# Patient Record
Sex: Male | Born: 1935 | Race: White | Hispanic: No | Marital: Married | Smoking: Never smoker
Health system: Southern US, Community
[De-identification: ages and names within clinical notes are randomized; demographics above are authoritative.]

## PROBLEM LIST (undated history)

## (undated) DIAGNOSIS — N289 Disorder of kidney and ureter, unspecified: Secondary | ICD-10-CM

## (undated) DIAGNOSIS — H9190 Unspecified hearing loss, unspecified ear: Secondary | ICD-10-CM

## (undated) DIAGNOSIS — M545 Low back pain: Secondary | ICD-10-CM

## (undated) DIAGNOSIS — F32A Depression, unspecified: Secondary | ICD-10-CM

## (undated) DIAGNOSIS — I10 Essential (primary) hypertension: Secondary | ICD-10-CM

## (undated) DIAGNOSIS — H509 Unspecified strabismus: Secondary | ICD-10-CM

## (undated) DIAGNOSIS — M7500 Adhesive capsulitis of unspecified shoulder: Secondary | ICD-10-CM

## (undated) DIAGNOSIS — E669 Obesity, unspecified: Secondary | ICD-10-CM

## (undated) DIAGNOSIS — H3530 Unspecified macular degeneration: Secondary | ICD-10-CM

## (undated) DIAGNOSIS — R413 Other amnesia: Secondary | ICD-10-CM

## (undated) DIAGNOSIS — G20 Parkinson's disease: Secondary | ICD-10-CM

## (undated) DIAGNOSIS — G4752 REM sleep behavior disorder: Secondary | ICD-10-CM

## (undated) DIAGNOSIS — N189 Chronic kidney disease, unspecified: Secondary | ICD-10-CM

## (undated) DIAGNOSIS — E785 Hyperlipidemia, unspecified: Secondary | ICD-10-CM

## (undated) DIAGNOSIS — B029 Zoster without complications: Secondary | ICD-10-CM

## (undated) DIAGNOSIS — F329 Major depressive disorder, single episode, unspecified: Secondary | ICD-10-CM

## (undated) DIAGNOSIS — G8929 Other chronic pain: Secondary | ICD-10-CM

## (undated) DIAGNOSIS — G20A1 Parkinson's disease without dyskinesia, without mention of fluctuations: Secondary | ICD-10-CM

## (undated) HISTORY — DX: Disorder of kidney and ureter, unspecified: N28.9

## (undated) HISTORY — DX: Adhesive capsulitis of unspecified shoulder: M75.00

## (undated) HISTORY — DX: Unspecified hearing loss, unspecified ear: H91.90

## (undated) HISTORY — DX: Depression, unspecified: F32.A

## (undated) HISTORY — DX: Low back pain: M54.5

## (undated) HISTORY — DX: Chronic kidney disease, unspecified: N18.9

## (undated) HISTORY — DX: Other amnesia: R41.3

## (undated) HISTORY — DX: Parkinson's disease: G20

## (undated) HISTORY — DX: Essential (primary) hypertension: I10

## (undated) HISTORY — PX: COLONOSCOPY W/ POLYPECTOMY: SHX1380

## (undated) HISTORY — DX: Zoster without complications: B02.9

## (undated) HISTORY — DX: Obesity, unspecified: E66.9

## (undated) HISTORY — DX: Unspecified macular degeneration: H35.30

## (undated) HISTORY — DX: Unspecified strabismus: H50.9

## (undated) HISTORY — DX: Parkinson's disease without dyskinesia, without mention of fluctuations: G20.A1

## (undated) HISTORY — DX: Other chronic pain: G89.29

## (undated) HISTORY — DX: Hyperlipidemia, unspecified: E78.5

## (undated) HISTORY — DX: Major depressive disorder, single episode, unspecified: F32.9

## (undated) HISTORY — PX: DUPUYTREN CONTRACTURE RELEASE: SHX1478

## (undated) HISTORY — DX: REM sleep behavior disorder: G47.52

## (undated) HISTORY — PX: EYE SURGERY: SHX253

---

## 2008-05-06 ENCOUNTER — Ambulatory Visit: Payer: Self-pay | Admitting: Cardiology

## 2008-05-13 ENCOUNTER — Ambulatory Visit: Payer: Self-pay

## 2008-05-26 ENCOUNTER — Encounter: Admission: RE | Admit: 2008-05-26 | Discharge: 2008-06-10 | Payer: Self-pay | Admitting: Internal Medicine

## 2008-05-28 ENCOUNTER — Ambulatory Visit: Payer: Self-pay | Admitting: Cardiology

## 2008-06-03 ENCOUNTER — Ambulatory Visit: Payer: Self-pay | Admitting: Cardiology

## 2008-06-03 ENCOUNTER — Ambulatory Visit: Payer: Self-pay

## 2008-06-03 ENCOUNTER — Encounter: Payer: Self-pay | Admitting: Cardiology

## 2008-06-03 LAB — CONVERTED CEMR LAB
CO2: 25 meq/L (ref 19–32)
Calcium: 9 mg/dL (ref 8.4–10.5)
Chloride: 105 meq/L (ref 96–112)
Glucose, Bld: 163 mg/dL — ABNORMAL HIGH (ref 70–99)
Potassium: 4.4 meq/L (ref 3.5–5.1)
Sodium: 136 meq/L (ref 135–145)

## 2008-07-14 ENCOUNTER — Encounter: Admission: RE | Admit: 2008-07-14 | Discharge: 2008-07-14 | Payer: Self-pay | Admitting: Neurology

## 2008-11-10 ENCOUNTER — Encounter: Admission: RE | Admit: 2008-11-10 | Discharge: 2009-01-28 | Payer: Self-pay | Admitting: Internal Medicine

## 2010-08-16 NOTE — Assessment & Plan Note (Signed)
[facility]                            CARDIOLOGY OFFICE NOTE   Anthony Lambert, Anthony Lambert                       MRN:          [id number]  DATE:05/28/2008                            DOB:          1934-06-25    PRIMARY CARE PHYSICIAN:  Kendrick Ranch, MD, [facility] care [facility].   CLINICAL HISTORY:  Anthony Lambert returned for followup visit after his  evaluation for chest pain and abnormal ECG.  His electrocardiogram  showed nonspecific ST-T changes and APCs.  He is having atypical pain,  but had positive risk profile with hypertension and hyperlipidemia.  We  did a Myoview scan which was negative for ischemia.   He has felt well and has had minimal chest pain since his last visit.   His past medical history is significant for Parkinson disease which was  recently diagnosed.  He also had a previous history of hand surgery.   Current medications include Lexapro, fenofibrate, aspirin, omega-3,  zinc, and ropinirole.   On examination, there was no venous distention.  The carotid pulses were  full without bruits.  Chest is clear.  Cardiac rhythm is regular.  No  murmurs or gallops.  The abdomen was soft without organomegaly.  Peripheral pulses were full with no peripheral edema.   IMPRESSION:  1. Chest pain and abnormal electrocardiogram, felt to be noncardiac      chest pain.  2. Hypertension.  3. Hyperlipidemia.  4. Parkinson disease.   RECOMMENDATIONS:  I do not believe Anthony Lambert has significant ischemic  heart disease at present.  He indicates his blood pressures at home are  in the 150-160 range, so I think he does require treatment and  hypertension may explain his abnormal ECG.  We will plan to start him on  Lotensin 20 a day, and I will plan to followup echo to evaluate him for  LVH and the abnormal ECG.  We will get a BMP when he comes in.  If these  studies are okay, I will turn him back over to Dr.  Constance Goltz and see  him back on a p.r.n. basis.  He is scheduled to see Dr. Anne Hahn for his  Parkinson disease in a few weeks.     Bruce Elvera Lennox Juanda Chance, MD, [facility]  Electronically Signed    BRB/MedQ  DD: 05/28/2008  DT: 05/28/2008  Job #: [id number]

## 2010-08-16 NOTE — Assessment & Plan Note (Signed)
[facility]                            CARDIOLOGY OFFICE NOTE   HAWTHORNE, DAY                       MRN:          [id number]  DATE:05/06/2008                            DOB:          10-10-1934    CARDIAC CONSULTATION NOTE.   PRIMARY CARE PHYSICIAN:  Kendrick Ranch, M.D. who is at the  [facility], [facility].   REASON FOR REFERRAL:  Evaluation of chest pain and abnormal ECG.   CLINICAL HISTORY:  Mr. Anthony Lambert is a 76 year old and recently moved here  from [place] to be with his wife and son.  He has no prior history of known  heart disease.  He has been having some tightness in his lower chest  over the past year which has not been predictive relating to exertion,  but which sometimes does come on with exertion.  He saw Dr. Constance Goltz  today and she did an electrocardiogram which she faxed over here for  review.  The electrocardiogram showed APCs and showed some flattening of  the T-waves in the lateral precordial leads.  He was referred over here  for further evaluation of his chest pain and abnormal ECG.   He says he has had no symptoms of shortness of breath or palpitations.   PAST MEDICAL HISTORY:  Significant for recently diagnosed Parkinson's  disease.  He has not seen a neurologist since he moved to [place] in  September.  He also has a history of hypertension, but has not been  treated recently.  He had a history of previous hand surgery.  He also  has a history of hyperlipidemia.   CURRENT MEDICATIONS:  1. Lexapro 5 mg daily.  2. Ropinirole 2 mg daily.  3. Fenofibrate 16o mg daily.  4. Aspirin 81 mg daily.  5. Omega-3 and zinc.   FAMILY HISTORY:  Mildly positive for vascular disease.  His mother died  at 20 of a possible stroke and had hypertension.  His father was killed  when the patient was a child.   SOCIAL HISTORY:  He is a retired Brewing technologist.  He does not smoke.  He and  his wife moved here from [place] in the last September to be with  their son.  They have another son who lives in [place].   REVIEW OF SYSTEMS:  Positive for fatigue and constipation.   PHYSICAL EXAMINATION:  VITAL SIGNS:  Blood pressure 140/90, pulse 60 and  regular.  The the weight was 273 pounds.  NECK:  There was no venous tension.  The carotid pulses were full and  there were no bruits.  HEAD:  Normocephalic.  CHEST:  Clear without rales or rhonchi.  CARDIAC:  Rhythm was regular.  The heart sounds were normoactive.  No  murmurs, rubs, or gallops.  ABDOMEN:  Soft with normal bowel sounds.  There is no  hepatosplenomegaly.  EXTREMITIES:  Peripheral pulses were full with no peripheral edema.  MUSCULOSKELETAL:  No deformities.  SKIN:  Warm and dry.  NEUROLOGIC:  No focal neurological signs.   IMPRESSION:  1. Chest pain and  abnormal ECG, rule out ischemia.  2. Parkinson disease.  3. History of hypertension.  4. Hyperlipidemia.   RECOMMENDATIONS:  Mr. Walck symptoms are not typical for ischemic  heart disease, but he does have an abnormal ECG and some risk factors.  Will plan to evaluate him further with a rest stress Myoview scan.  I  will plan to see him back in followup.  After the scan, we can decide  about further evaluation.  I will get some laboratory studies including  a CBC, BMP, lipid, and liver.  We will check it with Dr. Constance Goltz so  as not to repeat any studies that she got there.     Bruce Elvera Lennox Juanda Chance, MD, [facility]  Electronically Signed    BRB/MedQ  DD: 05/06/2008  DT: 05/07/2008  Job #: [id number]   cc:   Kendrick Ranch, M.D.

## 2010-11-03 DIAGNOSIS — F329 Major depressive disorder, single episode, unspecified: Secondary | ICD-10-CM | POA: Insufficient documentation

## 2010-11-03 DIAGNOSIS — Z860100 Personal history of colon polyps, unspecified: Secondary | ICD-10-CM | POA: Insufficient documentation

## 2010-11-14 ENCOUNTER — Encounter: Payer: Self-pay | Admitting: Podiatry

## 2011-09-04 DIAGNOSIS — E785 Hyperlipidemia, unspecified: Secondary | ICD-10-CM | POA: Insufficient documentation

## 2012-04-19 DIAGNOSIS — G4752 REM sleep behavior disorder: Secondary | ICD-10-CM | POA: Insufficient documentation

## 2012-04-19 DIAGNOSIS — S339XXA Sprain of unspecified parts of lumbar spine and pelvis, initial encounter: Secondary | ICD-10-CM | POA: Insufficient documentation

## 2012-04-19 DIAGNOSIS — G20 Parkinson's disease: Secondary | ICD-10-CM | POA: Insufficient documentation

## 2012-04-19 DIAGNOSIS — R1310 Dysphagia, unspecified: Secondary | ICD-10-CM | POA: Insufficient documentation

## 2012-04-19 DIAGNOSIS — R413 Other amnesia: Secondary | ICD-10-CM | POA: Insufficient documentation

## 2012-04-19 DIAGNOSIS — R269 Unspecified abnormalities of gait and mobility: Secondary | ICD-10-CM | POA: Insufficient documentation

## 2012-05-01 ENCOUNTER — Ambulatory Visit (HOSPITAL_COMMUNITY): Admission: RE | Admit: 2012-05-01 | Payer: Self-pay | Source: Ambulatory Visit

## 2012-05-01 ENCOUNTER — Other Ambulatory Visit (HOSPITAL_COMMUNITY): Payer: Self-pay

## 2012-05-01 ENCOUNTER — Other Ambulatory Visit (HOSPITAL_COMMUNITY): Payer: Self-pay | Admitting: Neurology

## 2012-05-01 DIAGNOSIS — R1310 Dysphagia, unspecified: Secondary | ICD-10-CM

## 2012-05-07 ENCOUNTER — Ambulatory Visit (HOSPITAL_COMMUNITY)
Admission: RE | Admit: 2012-05-07 | Discharge: 2012-05-07 | Disposition: A | Discharge status: Home / Self Care | Payer: Medicare Other | Source: Ambulatory Visit | Attending: Neurology | Admitting: Neurology

## 2012-05-07 DIAGNOSIS — H3530 Unspecified macular degeneration: Secondary | ICD-10-CM | POA: Insufficient documentation

## 2012-05-07 DIAGNOSIS — R1310 Dysphagia, unspecified: Secondary | ICD-10-CM

## 2012-05-07 DIAGNOSIS — R1312 Dysphagia, oropharyngeal phase: Secondary | ICD-10-CM | POA: Insufficient documentation

## 2012-05-07 NOTE — Procedures (Cosign Needed)
Objective Swallowing Evaluation: Modified Barium Swallowing Study  Patient Details  Name: Anthony Lambert MRN: [id number] Date of Birth: March 14, 1935  Today's Date: 05/07/2012 Time: 1106-1210 SLP Time Calculation (min): 64 min  Past Medical History: No past medical history on file. Past Surgical History: No past surgical history on file. HPI:  Anthony Lambert is a 78 year old man with dx of PD. He was referred to [facility] by his primary doctor for reported dysphagia and pt c/o coughing/choking after meals and increased belching during meals with symptoms persisting over past 5 years.      Assessment / Plan / Recommendation Clinical Impression  Dysphagia Diagnosis: Suspected primary esophageal dysphagia;Mild oral phase dysphagia;Mild pharyngeal phase dysphagia Clinical impression: Patient presents with a mild orophayrngeal dysphagia. Oral phase characterized by oral discoordination resulting in a delay in pharyngeal swallow initiation and oral residue with thin liquids with premature spillage to the level of the valleculae and at times to the pyriform sinuses as patient clears with spontaneous dry swallow. The combination of above results in mild penetration of thin liquids which remained above vocal cords. SLP provided verbal cues for chin tuck which did not prevent penetration however verbal cues for small, single sips which reduced the amount of pooling in the valleculae prior to swallow and prevented penetration. Patient presented with strong pharyngeal strength and airway protection with all solid po's, however continued to produce a spontaneous cough after the swallow. Question indicative of possible reflux/esophageal disorder given known h/o GERD?  Defer management of possible GERD-like symptoms to MD. Therapist educated patient and wife on risks of aspiration and recommendations for taking small sips of thin liquid between bites, no straws, and to take meds with puree. Therapist also  recommends possible f/u with outside SLP for voice/swallowing therapy. Patient stated he would consider f/u therapy.     Treatment Recommendation  Defer treatment plan to SLP at (Comment) (OP)    Diet Recommendation Regular;Thin liquid   Liquid Administration via: No straw;Cup Medication Administration: Whole meds with puree Supervision: Patient able to self feed;Intermittent supervision to cue for compensatory strategies Compensations: Slow rate;Small sips/bites;Follow solids with liquid Postural Changes and/or Swallow Maneuvers: Seated upright 90 degrees;Upright 30-60 min after meal    Other  Recommendations Oral Care Recommendations: Oral care BID   Follow Up Recommendations  Outpatient SLP       Pertinent Vitals/Pain None reported     General Date of Onset:  (patient reported symptoms started 5 years ago) HPI: Anthony Lambert is a 78 year old man with dx of PD. He was [facility] to [facility] by his primary doctor for reported dysphagia and pt c/o coughing/choking after meals and increased belching during meals with symptoms persisting over past 5 years.  Type of Study: Modified Barium Swallowing Study Reason for Referral: Objectively evaluate swallowing function Previous Swallow Assessment: none Diet Prior to this Study: Regular;Thin liquids Temperature Spikes Noted: No Respiratory Status: Room air History of Recent Intubation: No Behavior/Cognition: Alert;Cooperative;Pleasant mood;Hard of hearing Oral Cavity - Dentition: Adequate natural dentition Oral Motor / Sensory Function: Within functional limits Self-Feeding Abilities: Able to feed self Patient Positioning: Upright in chair Baseline Vocal Quality: Low vocal intensity;Clear Volitional Cough: Strong Volitional Swallow: Able to elicit Anatomy: Within functional limits Pharyngeal Secretions: Not observed secondary MBS    Reason for Referral Objectively evaluate swallowing function   Oral Phase Oral  Preparation/Oral Phase Oral Phase: Impaired Oral - Pudding Oral - Pudding Teaspoon: Not Tested Oral - Pudding  Cup: Not tested Oral - Thin Oral - Thin Cup: Lingual pumping;Lingual/palatal residue;Delayed oral transit (decreased bolus containment/cohesion) Oral - Thin Straw: Lingual pumping;Delayed oral transit;Lingual/palatal residue (decreased bolus containment/cohesion) Oral - Solids Oral - Puree: Within functional limits Oral - Mechanical Soft: Within functional limits Oral - Pill: Lingual pumping (provided whole with pureed solid)   Pharyngeal Phase Pharyngeal Phase Pharyngeal Phase: Impaired Pharyngeal - Thin Pharyngeal - Thin Cup: Delayed swallow initiation;Premature spillage to valleculae;Penetration/Aspiration before swallow (oral residuals pooled to vallecula post swallow; cleared ) Penetration/Aspiration details (thin cup): Material enters airway, remains ABOVE vocal cords and not ejected out (cued throat clear cleared penetrates) Pharyngeal - Thin Straw: Penetration/Aspiration before swallow;Delayed swallow initiation;Premature spillage to valleculae Penetration/Aspiration details (thin straw): Material enters airway, remains ABOVE vocal cords and not ejected out (penetrates cleared with cued throat clear) Pharyngeal - Solids Pharyngeal - Puree: Delayed swallow initiation;Premature spillage to valleculae Pharyngeal - Mechanical Soft: Within functional limits (Pt coughed, but no aspiration observed) Pharyngeal - Pill: Within functional limits (provided whole with pureed solid)  Cervical Esophageal Phase    GO    Cervical Esophageal Phase Cervical Esophageal Phase: [facility]    Functional Assessment Tool Used: skilled observation Functional Limitations: Swallowing Swallow Current Status (W0981): At least 20 percent but less than 40 percent impaired, limited or restricted Swallow Goal Status [phone]): At least 20 percent but less than 40 percent impaired, limited or  restricted Swallow Discharge Status [phone]): At least 20 percent but less than 40 percent impaired, limited or restricted   Berdine Dance SLP student Berdine Dance 05/07/2012, 2:25 PM

## 2012-08-04 ENCOUNTER — Other Ambulatory Visit (HOSPITAL_COMMUNITY): Payer: Self-pay | Admitting: Neurology

## 2012-08-07 ENCOUNTER — Other Ambulatory Visit: Payer: Self-pay

## 2012-08-07 MED ORDER — CARBIDOPA-LEVODOPA 25-250 MG PO TABS: ORAL_TABLET | ORAL | Status: DC

## 2012-08-07 NOTE — Telephone Encounter (Signed)
Pharmacy Called requesting we resend rx.

## 2012-08-13 ENCOUNTER — Encounter: Payer: Self-pay | Admitting: Neurology

## 2012-08-13 ENCOUNTER — Ambulatory Visit (INDEPENDENT_AMBULATORY_CARE_PROVIDER_SITE_OTHER): Payer: Medicare Other | Admitting: Neurology

## 2012-08-13 VITALS — BP 111/66 | HR 63 | Ht 73.5 in | Wt 255.0 lb

## 2012-08-13 DIAGNOSIS — G4752 REM sleep behavior disorder: Secondary | ICD-10-CM

## 2012-08-13 DIAGNOSIS — G20 Parkinson's disease: Secondary | ICD-10-CM

## 2012-08-13 DIAGNOSIS — R269 Unspecified abnormalities of gait and mobility: Secondary | ICD-10-CM

## 2012-08-13 DIAGNOSIS — R1310 Dysphagia, unspecified: Secondary | ICD-10-CM

## 2012-08-13 DIAGNOSIS — R413 Other amnesia: Secondary | ICD-10-CM

## 2012-08-13 MED ORDER — CARBIDOPA-LEVODOPA 25-250 MG PO TABS: ORAL_TABLET | ORAL | Status: DC

## 2012-08-13 MED ORDER — DONEPEZIL HCL 10 MG PO TABS: 10.0000 mg | ORAL_TABLET | Freq: Every day | ORAL | Status: DC

## 2012-08-13 NOTE — Progress Notes (Signed)
Reason for visit: Parkinson's disease  Anthony Lambert is an 78 y.o. male  History of present illness:  Anthony Lambert is a 78 year old right-handed white male with a history of Parkinson's disease and a memory disturbance. The patient has been tapered off of the Requip, and he feels better off of the medication. The patient is on Sinemet, but he never went up on the dose, and he is still taking one half of a 25/250 mg tablet 3 times daily. The patient goes to bed around 3 AM, and he wakes up around 12 noon or 1 PM. The patient does report some mild problems with swallowing. The patient has low back pain with standing, and he has a chronically stooped posture. The pain in the low back goes away when he sits down. The patient denies any falls. Overall, the patient believes that he is functioning somewhat better off of the Requip. He still has some daytime drowsiness.  Past Medical History  Diagnosis Date  . Parkinson's disease   . Depression   . Dyslipidemia   . Hypertension   . Obesity   . Frozen shoulder     Left  . Memory disturbance   . REM sleep behavior disorder   . Chronic low back pain   . Macular degeneration   . Chronic renal insufficiency   . Shingles     Right partietal shingles    Past Surgical History  Procedure Laterality Date  . Colonoscopy w/ polypectomy    . Dupuytren contracture release Bilateral     Family History  Problem Relation Age of Onset  . Cancer - Colon Mother   . Cancer Sister     Breast cancer  . Cancer - Prostate Brother     Social history:  reports that he has never smoked. He does not have any smokeless tobacco history on file. He reports that  drinks alcohol. He reports that he does not use illicit drugs.  Allergies: No Known Allergies  Medications:  Current Outpatient Prescriptions on File Prior to Visit  Medication Sig Dispense Refill  . fenofibrate 160 MG tablet Take 160 mg by mouth daily.        Marland Kitchen lisinopril (PRINIVIL,ZESTRIL) 20 MG  tablet Take 20 mg by mouth daily.        . OMEGA 3 1000 MG CAPS Take 1,000 mg by mouth 3 (three) times daily.         No current facility-administered medications on file prior to visit.    ROS:  Out of a complete 14 system review of symptoms, the patient complains only of the following symptoms, and all other reviewed systems are negative.  Fatigue Hearing loss Visual blurring Blood in stool, constipation Impotence Memory loss, numbness, weakness Tremor Decreased energy  Blood pressure 111/66, pulse 63, height 6' 1.5" (1.867 m), weight 255 lb (115.667 kg).  Physical Exam  General: The patient is alert and cooperative at the time of the examination. The patient is moderately obese.  Skin: No significant peripheral edema is noted.   Neurologic Exam  Mental status: Mini-Mental status examination reveals a total score of 28/30. The patient is able to name 15 animals in 60 seconds.  Cranial nerves: Facial symmetry is present. Speech is normal, no aphasia or dysarthria is noted. Extraocular movements are full. Visual fields are full. Masking of the face is noted.  Motor: The patient has good strength in all 4 extremities. The patient is able to arise from a seated position with the  arms crossed.  Coordination: The patient has good finger-nose-finger and heel-to-shin bilaterally.  Gait and station: The patient has a stooped posture with walking. The patient has decreased arm swing bilaterally, right greater than left, with bilateral resting tremors seen. Tandem gait is unsteady. Romberg is negative. No drift is seen.  Reflexes: Deep tendon reflexes are symmetric, but are depressed.   Assessment/Plan:  1. Parkinson's disease  2. Memory disturbance  The patient will be maintained on Aricept. The Sinemet will once again will be increased to 1 full tablet in the morning, one half tablet at midday and in the evening. The patient will followup in 4-5 months.  Anthony Palau  MD 08/13/2012 7:33 PM  [facility] [address] [place], [place] [address]  Phone [phone] Fax [phone]

## 2012-09-07 DIAGNOSIS — K5900 Constipation, unspecified: Secondary | ICD-10-CM | POA: Insufficient documentation

## 2012-09-22 ENCOUNTER — Other Ambulatory Visit: Payer: Self-pay | Admitting: Neurology

## 2012-12-17 ENCOUNTER — Other Ambulatory Visit: Payer: Self-pay

## 2012-12-17 MED ORDER — DONEPEZIL HCL 10 MG PO TABS: 10.0000 mg | ORAL_TABLET | Freq: Every evening | ORAL | Status: DC

## 2012-12-17 NOTE — Telephone Encounter (Signed)
Anthony Lambert called requesting a refill of Aricept be sent to [facility].

## 2013-02-05 ENCOUNTER — Telehealth: Payer: Self-pay | Admitting: Neurology

## 2013-02-05 ENCOUNTER — Encounter (INDEPENDENT_AMBULATORY_CARE_PROVIDER_SITE_OTHER): Payer: Self-pay

## 2013-02-05 ENCOUNTER — Encounter: Payer: Self-pay | Admitting: Neurology

## 2013-02-05 ENCOUNTER — Ambulatory Visit (INDEPENDENT_AMBULATORY_CARE_PROVIDER_SITE_OTHER): Payer: Medicare Other | Admitting: Neurology

## 2013-02-05 VITALS — BP 118/60 | HR 70 | Wt 252.0 lb

## 2013-02-05 DIAGNOSIS — R269 Unspecified abnormalities of gait and mobility: Secondary | ICD-10-CM

## 2013-02-05 DIAGNOSIS — R413 Other amnesia: Secondary | ICD-10-CM

## 2013-02-05 DIAGNOSIS — R209 Unspecified disturbances of skin sensation: Secondary | ICD-10-CM

## 2013-02-05 DIAGNOSIS — G20 Parkinson's disease: Secondary | ICD-10-CM

## 2013-02-05 MED ORDER — CARBIDOPA-LEVODOPA 25-250 MG PO TABS: 1.0000 | ORAL_TABLET | Freq: Three times a day (TID) | ORAL | Status: DC

## 2013-02-05 NOTE — Progress Notes (Signed)
Reason for visit: Parkinson's disease  Anthony Lambert is an 78 y.o. male  History of present illness:  Anthony Lambert is a 78 year old right-handed white male with a history of Parkinson's disease. The patient has a stooped posture, and he indicates that he has a lot of low back pain when he is up walking. The patient will be up 5 minutes, and he will begin to have low back pain. The patient is not exercising much in part secondary to this. The patient believes that over the last several months, he has developed some problems with numbness of the right hand, and he is having difficulty holding an eating utensil in the hands. The patient has difficulty feeding himself. The patient reports that he has some neck pain when lying down at night, but the wife indicates that he uses 3 pillows to support his head. The patient has not gained much benefit from the slight increase in the Sinemet dosing on his last visit. The patient currently takes the 25/250 mg Sinemet tablets, one tablet in the morning, one half tablet at noon and half in the evening. The patient usually gets to sleep around 3 AM, waking up around 12 noon. The patient denies any falls, and he uses a cane for ambulation. The patient continues to have some memory issues. The patient does not operate a motor vehicle.  Past Medical History  Diagnosis Date  . Parkinson's disease   . Depression   . Dyslipidemia   . Hypertension   . Obesity   . Frozen shoulder     Left  . Memory disturbance   . REM sleep behavior disorder   . Chronic low back pain   . Macular degeneration   . Chronic renal insufficiency   . Shingles     Right partietal shingles    Past Surgical History  Procedure Laterality Date  . Colonoscopy w/ polypectomy    . Dupuytren contracture release Bilateral     Family History  Problem Relation Age of Onset  . Cancer - Colon Mother   . Cancer Sister     Breast cancer  . Cancer - Prostate Brother     Social history:   reports that he has never smoked. He has never used smokeless tobacco. He reports that he drinks alcohol. He reports that he does not use illicit drugs.   No Known Allergies  Medications:  Current Outpatient Prescriptions on File Prior to Visit  Medication Sig Dispense Refill  . donepezil (ARICEPT) 10 MG tablet Take 1 tablet (10 mg total) by mouth every evening.  90 tablet  1  . fenofibrate 160 MG tablet Take 160 mg by mouth daily.        Marland Kitchen lisinopril (PRINIVIL,ZESTRIL) 20 MG tablet Take 20 mg by mouth daily.        . OMEGA 3 1000 MG CAPS Take 1,000 mg by mouth 3 (three) times daily.         No current facility-administered medications on file prior to visit.    ROS:  Out of a complete 14 system review of symptoms, the patient complains only of the following symptoms, and all other reviewed systems are negative.  Hearing loss Achy muscles Memory loss, confusion, numbness, weakness, tremor Decreased energy Gait instability  Blood pressure 118/60, pulse 70, weight 252 lb (114.306 kg).  Physical Exam  General: The patient is alert and cooperative at the time of the examination. The patient is moderately obese.  Skin: No significant peripheral edema  is noted.   Neurologic Exam  Mental status: The Mini-Mental status examination done today shows a total score of 26/30.  Cranial nerves: Facial symmetry is present. Speech is normal, no aphasia or dysarthria is noted. Extraocular movements are full. Visual fields are full.  Motor: The patient has good strength in all 4 extremities.  Sensory examination: Soft touch sensation on the face, arms, and legs is symmetric.  Coordination: The patient has good finger-nose-finger and heel-to-shin bilaterally. Resting tremors are prominent both upper chimneys, left lower extremity.  Gait and station: The patient is able to arise from a seated position with arms crossed. Once up, the patient reveals a stooped posture, slightly decreased arm  swing, prominent tremors in both arms. The patient has relatively good stride with walking, some hesitation with initiation of walking, and with turns. Tandem gait is slightly unsteady. Romberg is negative. No drift is seen.  Reflexes: Deep tendon reflexes are symmetric.   Assessment/Plan:  1. Parkinson's disease  2. Gait disturbance  3. Low back pain  4. Right hand numbness  The patient will be set up for nerve conduction studies of the upper extremities to exclude the possibility of carpal tunnel syndrome that may be causing his difficulty with using his right hand. The patient has low back pain likely associated with a posture associated with Parkinson's disease. The patient will be given a low back brace to see this helps. The patient has been encouraged to enter an exercise program. The patient will be increased on Sinemet taking the 25/250 mg tablets, one tablet 3 times daily. The patient will followup in 3-4 months.  [name] MD 02/05/2013 6:02 PM  [facility] [address] [place], [place] [address]  Phone [phone] Fax [phone]

## 2013-02-05 NOTE — Patient Instructions (Signed)

## 2013-02-14 ENCOUNTER — Ambulatory Visit (INDEPENDENT_AMBULATORY_CARE_PROVIDER_SITE_OTHER): Payer: Medicare Other

## 2013-02-14 DIAGNOSIS — R209 Unspecified disturbances of skin sensation: Secondary | ICD-10-CM

## 2013-02-14 NOTE — Procedures (Signed)
     HISTORY:  Anthony Lambert is a 78 year old patient with a history of Parkinson's disease who has noted some problems with numbness of the right hand over the last several months, with difficulty using the hand to feed himself. The patient is being evaluated for a possible neuropathy.  NERVE CONDUCTION STUDIES:  Nerve conduction studies were performed on both upper extremities. The distal motor latencies and motor amplitudes for the median and ulnar nerves were within normal limits. The F wave latencies and nerve conduction velocities for these nerves were also normal. The sensory latencies for the median and ulnar nerves were normal.   EMG STUDIES:  EMG evaluation was not performed.  IMPRESSION:  Nerve conduction studies of both upper extremities were unremarkable. There is no evidence of carpal tunnel syndrome on either side.  Anthony Palau MD 02/14/2013 3:50 PM  [facility] [address] [place], [place] [address]  Phone [phone] Fax [phone]

## 2013-02-14 NOTE — Telephone Encounter (Signed)
I called the patient. The NCV of the arms was normal. The study does not explain the numbness in the right hand, with difficulty using the hand. If this progresses, workup on this week, the patient is to let me know.

## 2013-02-19 DIAGNOSIS — M1990 Unspecified osteoarthritis, unspecified site: Secondary | ICD-10-CM | POA: Insufficient documentation

## 2013-05-09 ENCOUNTER — Other Ambulatory Visit: Payer: Self-pay | Admitting: Neurology

## 2013-05-30 DIAGNOSIS — Z6833 Body mass index (BMI) 33.0-33.9, adult: Secondary | ICD-10-CM | POA: Insufficient documentation

## 2013-05-30 DIAGNOSIS — Z789 Other specified health status: Secondary | ICD-10-CM | POA: Insufficient documentation

## 2013-06-04 DIAGNOSIS — I10 Essential (primary) hypertension: Secondary | ICD-10-CM | POA: Insufficient documentation

## 2013-06-11 ENCOUNTER — Encounter (INDEPENDENT_AMBULATORY_CARE_PROVIDER_SITE_OTHER): Payer: Self-pay

## 2013-06-11 ENCOUNTER — Encounter: Payer: Self-pay | Admitting: Neurology

## 2013-06-11 ENCOUNTER — Ambulatory Visit (INDEPENDENT_AMBULATORY_CARE_PROVIDER_SITE_OTHER): Payer: Medicare Other | Admitting: Neurology

## 2013-06-11 VITALS — BP 108/59 | HR 66 | Wt 254.0 lb

## 2013-06-11 DIAGNOSIS — G20A1 Parkinson's disease without dyskinesia, without mention of fluctuations: Secondary | ICD-10-CM

## 2013-06-11 DIAGNOSIS — G20 Parkinson's disease: Secondary | ICD-10-CM

## 2013-06-11 DIAGNOSIS — R413 Other amnesia: Secondary | ICD-10-CM

## 2013-06-11 DIAGNOSIS — R269 Unspecified abnormalities of gait and mobility: Secondary | ICD-10-CM

## 2013-06-11 NOTE — Progress Notes (Signed)
Reason for visit: Parkinson's disease  Anthony Lambert is an 79 y.o. male  History of present illness:  Anthony Lambert is a 78 year old right-handed white male with a history of Parkinson's disease and a memory disorder. The patient has prominent tremors on all 4 extremities, right side worse than the left. The patient has had one fall since last seen when he tripped over his dog. The patient has chronic low back pain, but he is trying to remain active, walking on a regular basis. The patient has had no other new medical issues that have come up since last seen. The patient does report significant issues with constipation, and he currently is on daily MiraLax. The patient is not getting full control of the constipation on this medication. The patient may be going for a colonoscopy in the near future. The patient is on Sinemet taking the 25/250 mg tablets 3 times daily. The patient goes to bed around 10 PM, wakes up at 2 AM, and in may watch a movie until 4 or 5 AM. The patient will nap in the morning. The patient functions better in the afternoon. The patient does have occasional episodes of freezing with walking. The patient denies any issues with swallowing or choking. The memory remains a problem, and the patient is on Aricept.   Past Medical History  Diagnosis Date  . Parkinson's disease   . Depression   . Dyslipidemia   . Hypertension   . Obesity   . Frozen shoulder     Left  . Memory disturbance   . REM sleep behavior disorder   . Chronic low back pain   . Macular degeneration   . Chronic renal insufficiency   . Shingles     Right partietal shingles    Past Surgical History  Procedure Laterality Date  . Colonoscopy w/ polypectomy    . Dupuytren contracture release Bilateral     Family History  Problem Relation Age of Onset  . Cancer - Colon Mother   . Cancer Sister     Breast cancer  . Cancer - Prostate Brother     Social history:  reports that he has never smoked. He  has never used smokeless tobacco. He reports that he drinks alcohol. He reports that he does not use illicit drugs.   No Known Allergies  Medications:  Current Outpatient Prescriptions on File Prior to Visit  Medication Sig Dispense Refill  . ARICEPT 10 MG tablet TAKE 1 TABLET EVERY EVENING  90 tablet  1  . carbidopa-levodopa (SINEMET IR) 25-250 MG per tablet Take 1 tablet by mouth 3 (three) times daily. ONE FULL TABLET IN THE MORNING, ONE HALF TABLET AT NOON AND ONE HALF TABLET IN THE EVENING.  270 tablet  3  . fenofibrate 160 MG tablet Take 160 mg by mouth daily.        Marland Kitchen lisinopril (PRINIVIL,ZESTRIL) 20 MG tablet Take 20 mg by mouth daily.        . OMEGA 3 1000 MG CAPS Take 1,000 mg by mouth 3 (three) times daily.         No current facility-administered medications on file prior to visit.    ROS:  Out of a complete 14 system review of symptoms, the patient complains only of the following symptoms, and all other reviewed systems are negative.  Activity change  Hearing loss, ringing in the ears Dryness of the eyes Rectal bleeding, constipation, rectal pain Insomnia, sleep talking Back pain, walking difficulties, coordination  problems Numbness, tremors Decreased concentration  Blood pressure 108/59, pulse 66, weight 254 lb (115.214 kg).  Physical Exam  General: The patient is alert and cooperative at the time of the examination. The patient is moderately obese.  Skin: No significant peripheral edema is noted.   Neurologic Exam  Mental status: The Mini-Mental status examination done today shows a total score of 27/30.  Cranial nerves: Facial symmetry is present. Speech is normal, no aphasia or dysarthria is noted. Extraocular movements are full, but with superior gaze, there is divergence of the left eye. Visual fields are full. Masking of the face is seen.  Motor: The patient has good strength in all 4 extremities.  Sensory examination: Soft touch sensation is symmetric  on the face, arms, and legs.  Coordination: The patient has good finger-nose-finger and heel-to-shin bilaterally. The patient has prominent tremors on all 4 extremities, right greater left tremors are a true resting tremor.  Gait and station: The patient  Is able to arise from a seated position with the arms crossed.  Once up, the patient is able ambulate independently.there is some decreased arm swing bilaterally. Bilateral tremors are noted with the arms, right greater than left. Stride is somewhat shortened. Tandem gait was not attempted. Romberg is negative.   Reflexes: Deep tendon reflexes are symmetric.   Assessment/Plan:  One. Parkinson's disease  2. Memory disorder  3. Gait disorder  4. Tremors  5. Constipation  The patient is having significant issues with constipation. If medications do not seem to improve this issue, Linzess may be used in the future. The patient may be considered for addition of selegiline in the future. The patient will remain on his current dose of Sinemet at this time, and he will followup in about 4 months.  Anthony Lambert. Keith Sumer Moorehouse MD 06/11/2013 3:30 PM  [facility] [address] [place], [place] [id number]  Phone [phone] Fax [phone]

## 2013-06-11 NOTE — Patient Instructions (Signed)

## 2013-07-14 ENCOUNTER — Other Ambulatory Visit: Payer: Self-pay | Admitting: Gastroenterology

## 2013-07-14 DIAGNOSIS — K635 Polyp of colon: Secondary | ICD-10-CM

## 2013-07-15 ENCOUNTER — Other Ambulatory Visit: Payer: Medicare Other

## 2013-07-17 ENCOUNTER — Other Ambulatory Visit: Payer: Medicare Other

## 2013-09-30 ENCOUNTER — Other Ambulatory Visit: Payer: Self-pay | Admitting: Neurology

## 2013-10-29 ENCOUNTER — Encounter: Payer: Self-pay | Admitting: Podiatry

## 2013-10-29 ENCOUNTER — Ambulatory Visit (INDEPENDENT_AMBULATORY_CARE_PROVIDER_SITE_OTHER): Payer: Medicare Other | Admitting: Podiatry

## 2013-10-29 VITALS — BP 113/63 | HR 60 | Ht 74.0 in | Wt 248.0 lb

## 2013-10-29 DIAGNOSIS — M79605 Pain in left leg: Secondary | ICD-10-CM

## 2013-10-29 DIAGNOSIS — M79604 Pain in right leg: Secondary | ICD-10-CM | POA: Insufficient documentation

## 2013-10-29 DIAGNOSIS — R269 Unspecified abnormalities of gait and mobility: Secondary | ICD-10-CM

## 2013-10-29 DIAGNOSIS — B351 Tinea unguium: Secondary | ICD-10-CM

## 2013-10-29 DIAGNOSIS — M79609 Pain in unspecified limb: Secondary | ICD-10-CM

## 2013-10-29 DIAGNOSIS — M79606 Pain in leg, unspecified: Secondary | ICD-10-CM

## 2013-10-29 NOTE — Progress Notes (Signed)
Subjective: 79 year old male presents with wife complaining of having nail problem and hearing problem.  He has paralysis and has difficulty walking. Toes hurt.  Objective: Dermatologic: Thick yellow hypertrophic nails 1-5 right. Left side nails are normal. Vascular: All pedal pulses are palpable. No edema or erythema noted. Neurologic: Decreased response to Monofilament sensory testing bilateral. Orthopedic: Rectus foot without gross deformities.  Assessment: Onychomycosis x 5 right. Difficulty walking. Pain in toes.  Plan: Palliation prn. All nails debrided.  Patient wishes to return in 2 month for routine foot care.

## 2013-10-29 NOTE — Patient Instructions (Signed)
Seen for hypertrophic nails. All nails debrided. Return in 2 months or as needed.  

## 2013-11-28 ENCOUNTER — Ambulatory Visit (INDEPENDENT_AMBULATORY_CARE_PROVIDER_SITE_OTHER): Payer: Medicare Other | Admitting: Neurology

## 2013-11-28 ENCOUNTER — Encounter: Payer: Self-pay | Admitting: Neurology

## 2013-11-28 VITALS — BP 110/88 | HR 74 | Wt 243.0 lb

## 2013-11-28 DIAGNOSIS — G20 Parkinson's disease: Secondary | ICD-10-CM

## 2013-11-28 DIAGNOSIS — G4752 REM sleep behavior disorder: Secondary | ICD-10-CM

## 2013-11-28 DIAGNOSIS — R413 Other amnesia: Secondary | ICD-10-CM

## 2013-11-28 DIAGNOSIS — R269 Unspecified abnormalities of gait and mobility: Secondary | ICD-10-CM

## 2013-11-28 MED ORDER — CARBIDOPA-LEVODOPA 25-250 MG PO TABS: 1.0000 | ORAL_TABLET | Freq: Three times a day (TID) | ORAL | Status: DC

## 2013-11-28 NOTE — Patient Instructions (Signed)
Parkinson Disease Parkinson disease is a disorder of the central nervous system, which includes the brain and spinal cord. A person with this disease slowly loses the ability to completely control body movements. Within the brain, there is a group of nerve cells (basal ganglia) that help control movement. The basal ganglia are damaged and do not work properly in a person with Parkinson disease. In addition, the basal ganglia produce and use a brain chemical called dopamine. The dopamine chemical sends messages to other parts of the body to control and coordinate body movements. Dopamine levels are low in a person with Parkinson disease. If the dopamine levels are low, then the body does not receive the correct messages it needs to move normally.  CAUSES  The exact reason why the basal ganglia get damaged is not known. Some medical researchers have thought that infection, genes, environment, and certain medicines may contribute to the cause.  SYMPTOMS   An early symptom of Parkinson disease is often an uncontrolled shaking (tremor) of the hands. The tremor will often disappear when the affected hand is consciously used.  As the disease progresses, walking, talking, getting out of a chair, and new movements become more difficult.  Muscles get stiff and movements become slower.  Balance and coordination become harder.  Depression, trouble swallowing, urinary problems, constipation, and sleep problems can occur.  Later in the disease, memory and thought processes may deteriorate. DIAGNOSIS  There are no specific tests to diagnose Parkinson disease. You may be referred to a neurologist for evaluation. Your caregiver will ask about your medical history, symptoms, and perform a physical exam. Blood tests and imaging tests of your brain may be performed to rule out other diseases. The imaging tests may include an MRI or a CT scan. TREATMENT  The goal of treatment is to relieve symptoms. Medicines may be  prescribed once the symptoms become troublesome. Medicine will not stop the progression of the disease, but medicine can make movement and balance better and help control tremors. Speech and occupational therapy may also be prescribed. Sometimes, surgical treatment of the brain can be done in young people. HOME CARE INSTRUCTIONS  Get regular exercise and rest periods during the day to help prevent exhaustion and depression.  If getting dressed becomes difficult, replace buttons and zippers with Velcro and elastic on your clothing.  Take all medicine as directed by your caregiver.  Install grab bars or railings in your home to prevent falls.  Go to speech or occupational therapy as directed.  Keep all follow-up visits as directed by your caregiver. SEEK MEDICAL CARE IF:  Your symptoms are not controlled with your medicine.  You fall.  You have trouble swallowing or choke on your food. MAKE SURE YOU:  Understand these instructions.  Will watch your condition.  Will get help right away if you are not doing well or get worse. Document Released: 03/17/2000 Document Revised: 07/15/2012 Document Reviewed: 04/19/2011 Exit[employer] Patient Information 2015 [employer]. This information is not intended to replace advice given to you by your health care provider. Make sure you discuss any questions you have with your health care provider.  

## 2013-11-28 NOTE — Progress Notes (Signed)
Reason for visit: Parkinson's disease  Anthony Lambert is an 79 y.o. male  History of present illness:  Anthony Lambert is a 79 year old right-handed white male with a history of Parkinson's disease. The patient has had ongoing problems with constipation. The patient has been given a trial on Linzess, but he could not tolerate the medication because of abdominal cramps. The patient has had one fall in the shower otherwise, he has had good stability with his ability to ambulate. He has prominent tremors in both arms, right greater than left. The patient remains on his Sinemet taking the 25/250 mg tablet, one full tablet in the morning, and one half tablet at noon and evening. He is on Aricept for his memory issue. He continues to take MiraLax for his bowels. He returns to the office today for an evaluation.  Past Medical History  Diagnosis Date  . Parkinson's disease   . Depression   . Dyslipidemia   . Hypertension   . Obesity   . Frozen shoulder     Left  . Memory disturbance   . REM sleep behavior disorder   . Chronic low back pain   . Macular degeneration   . Chronic renal insufficiency   . Shingles     Right partietal shingles    Past Surgical History  Procedure Laterality Date  . Colonoscopy w/ polypectomy    . Dupuytren contracture release Bilateral     Family History  Problem Relation Age of Onset  . Cancer - Colon Mother   . Cancer Sister     Breast cancer  . Cancer - Prostate Brother     Social history:  reports that he has never smoked. He has never used smokeless tobacco. He reports that he drinks alcohol. He reports that he does not use illicit drugs.   No Known Allergies  Medications:  Current Outpatient Prescriptions on File Prior to Visit  Medication Sig Dispense Refill  . ARICEPT 10 MG tablet TAKE 1 TABLET EVERY EVENING  90 tablet  4  . fenofibrate 160 MG tablet Take 160 mg by mouth daily.        Marland Kitchen lisinopril (PRINIVIL,ZESTRIL) 20 MG tablet Take 20 mg  by mouth daily.        . OMEGA 3 1000 MG CAPS Take 1,000 mg by mouth 3 (three) times daily.        . polyethylene glycol (MIRALAX / GLYCOLAX) packet Take 17 g by mouth daily.       No current facility-administered medications on file prior to visit.    ROS:  Out of a complete 14 system review of symptoms, the patient complains only of the following symptoms, and all other reviewed systems are negative.  Hearing loss Rectal bleeding, constipation, rectal pain Insomnia, daytime sleepiness, sleep talking Back pain, muscle cramps, walking difficulty Numbness, tremors Decreased concentration  Blood pressure 110/88, pulse 74, weight 243 lb (110.224 kg).  Physical Exam  General: The patient is alert and cooperative at the time of the examination.  Skin: No significant peripheral edema is noted.   Neurologic Exam  Mental status: The Mini-Mental status examination done today shows a total score of 25/30.  Cranial nerves: Facial symmetry is present. Speech is normal, no aphasia or dysarthria is noted. Speech is somewhat hypophonic. Extraocular movements are full, with exception that there is some divergence of gaze with exotropia of the left eye with superior gaze. Visual fields are full.  Motor: The patient has good strength in  all 4 extremities.  Sensory examination: Soft touch sensation is symmetric on the face, arms, and legs.  Coordination: The patient has good finger-nose-finger and heel-to-shin bilaterally. Prominent resting tremors are seen bilaterally, right greater than left. A resting tremor is also seen with the right leg.  Gait and station: The patient has a normal gait. The patient has good arm swing and good stride with walking. He is able to arise from a seated position with arms crossed. Tandem gait is slightly unsteady. Romberg is negative. No drift is seen.  Reflexes: Deep tendon reflexes are symmetric.   Assessment/Plan:  One. Parkinson's disease  2. Memory  disorder  3. REM sleep disorder  The patient will continue the Sinemet as is. I have recommended the addition of selegiline secondary to some problems with mild freezing. The patient does not wish to go on new medications at this time. He will followup in about 5 months. He will contact our office if new issues arise.  Anthony Palau MD 11/30/2013 3:06 PM  [facility] [address] [place], [place] [address]  Phone [phone] Fax [phone]

## 2013-12-01 ENCOUNTER — Other Ambulatory Visit: Payer: Self-pay

## 2013-12-01 MED ORDER — CARBIDOPA-LEVODOPA 25-250 MG PO TABS: ORAL_TABLET | ORAL | Status: DC

## 2013-12-01 NOTE — Telephone Encounter (Signed)
The patient remains on his Sinemet taking the 25/250 mg tablet, one full tablet in the morning, and one half tablet at noon and evening

## 2013-12-30 ENCOUNTER — Encounter: Payer: Self-pay | Admitting: Podiatry

## 2013-12-30 ENCOUNTER — Ambulatory Visit (INDEPENDENT_AMBULATORY_CARE_PROVIDER_SITE_OTHER): Payer: Medicare Other | Admitting: Podiatry

## 2013-12-30 VITALS — BP 147/66 | HR 65 | Ht 74.0 in | Wt 248.0 lb

## 2013-12-30 DIAGNOSIS — M79606 Pain in leg, unspecified: Secondary | ICD-10-CM

## 2013-12-30 DIAGNOSIS — M79609 Pain in unspecified limb: Secondary | ICD-10-CM

## 2013-12-30 DIAGNOSIS — B351 Tinea unguium: Secondary | ICD-10-CM

## 2013-12-30 NOTE — Patient Instructions (Signed)
Seen for hypertrophic nails. All nails debrided. Return in 3 months or as needed.  

## 2013-12-30 NOTE — Progress Notes (Signed)
Subjective:  [id number] year old male presents with wife requesting toe nails trimmed. He has tremor on right side and has difficulty walking.   Objective: Dermatologic: Thick yellow hypertrophic nails 1-5 right. Left side nails are normal.  Vascular: All pedal pulses are palpable. No edema or erythema noted.  Neurologic: Decreased response to Monofilament sensory testing bilateral.  Orthopedic: Rectus foot without gross deformities.   Assessment: Onychomycosis x 5 right.  Difficulty walking.  Pain in toes.   Plan: Palliation prn.  All nails debrided.  Patient wishes to return in 2 month for routine foot care

## 2014-02-18 ENCOUNTER — Encounter: Payer: Self-pay | Admitting: Neurology

## 2014-02-24 ENCOUNTER — Encounter: Payer: Self-pay | Admitting: Neurology

## 2014-03-31 ENCOUNTER — Encounter: Payer: Self-pay | Admitting: Podiatry

## 2014-03-31 ENCOUNTER — Ambulatory Visit (INDEPENDENT_AMBULATORY_CARE_PROVIDER_SITE_OTHER): Payer: Medicare Other | Admitting: Podiatry

## 2014-03-31 DIAGNOSIS — B351 Tinea unguium: Secondary | ICD-10-CM

## 2014-03-31 DIAGNOSIS — M79606 Pain in leg, unspecified: Secondary | ICD-10-CM

## 2014-03-31 NOTE — Patient Instructions (Signed)
Seen for hypertrophic nails. All nails debrided. Return in 3 months or as needed.  

## 2014-03-31 NOTE — Progress Notes (Signed)
Subjective:  79 year old male presents with wife requesting toe nails trimmed. He has tremor on right side and has difficulty walking.  Left great toe is very sore.  Objective: Dermatologic: Thick yellow hypertrophic nails 1-5 right. Left side nails are normal.  Vascular: All pedal pulses are palpable. No edema or erythema noted.  Neurologic: Decreased response to Monofilament sensory testing bilateral.  Orthopedic: Rectus foot without gross deformities.   Assessment: Onychomycosis x 5 right.  Difficulty walking.  Pain in toes.   Plan: Palliation prn.  All nails debrided.  Patient wishes to return in 2 month for routine foot care

## 2014-06-01 ENCOUNTER — Telehealth: Payer: Self-pay | Admitting: Neurology

## 2014-06-01 MED ORDER — CARBIDOPA-LEVODOPA 25-250 MG PO TABS: ORAL_TABLET | ORAL | Status: DC

## 2014-06-01 NOTE — Telephone Encounter (Signed)
Patient's spouse requesting 90 day supply for Rx carbidopa-levodopa (SINEMET IR) 25-250 MG per tablet, please forward to [facility].  Patient will be out of medication on 06/11/14.  Please call and advise.

## 2014-06-29 ENCOUNTER — Encounter: Payer: Self-pay | Admitting: Neurology

## 2014-06-29 ENCOUNTER — Ambulatory Visit (INDEPENDENT_AMBULATORY_CARE_PROVIDER_SITE_OTHER): Payer: Medicare Other | Admitting: Neurology

## 2014-06-29 VITALS — BP 141/89 | HR 53 | Ht 74.0 in | Wt 249.8 lb

## 2014-06-29 DIAGNOSIS — G4752 REM sleep behavior disorder: Secondary | ICD-10-CM | POA: Diagnosis not present

## 2014-06-29 DIAGNOSIS — R413 Other amnesia: Secondary | ICD-10-CM | POA: Diagnosis not present

## 2014-06-29 DIAGNOSIS — G20 Parkinson's disease: Secondary | ICD-10-CM | POA: Diagnosis not present

## 2014-06-29 DIAGNOSIS — R269 Unspecified abnormalities of gait and mobility: Secondary | ICD-10-CM | POA: Diagnosis not present

## 2014-06-29 MED ORDER — CARBIDOPA-LEVODOPA 25-250 MG PO TABS: 1.0000 | ORAL_TABLET | Freq: Three times a day (TID) | ORAL | Status: DC

## 2014-06-29 NOTE — Progress Notes (Signed)
Reason for visit: Parkinson's disease  Anthony Lambert is an 80 y.o. male  History of present illness:  Anthony Lambert is a 80 year old right-handed white male with a history of Parkinson's disease. The patient has not had any falls since last seen, he is having increasing problems with freezing while walking, particularly when going through doors. The patient has an irregular sleep cycle, he will stay up at night, and sleep all the next day. He did not sleep well the night prior to this visit, and he is not functioning well at this time. The patient uses a cane for ambulation. He in the past did not wish to go on any new medication such as Eldepryl for the freezing it episodes. The patient is on Sinemet, his dose was increased to 25/250 mg tablets taking one tablet 3 times daily. The patient does have a mild memory problem, and he is on Aricept for this. He returns for an evaluation. The memory issue has not altered significantly since last seen.  Past Medical History  Diagnosis Date  . Parkinson's disease   . Depression   . Dyslipidemia   . Hypertension   . Obesity   . Frozen shoulder     Left  . Memory disturbance   . REM sleep behavior disorder   . Chronic low back pain   . Macular degeneration   . Chronic renal insufficiency   . Shingles     Right partietal shingles    Past Surgical History  Procedure Laterality Date  . Colonoscopy w/ polypectomy    . Dupuytren contracture release Bilateral     Family History  Problem Relation Age of Onset  . Cancer - Colon Mother   . Cancer Sister     Breast cancer  . Cancer - Prostate Brother     Social history:  reports that he has never smoked. He has never used smokeless tobacco. He reports that he does not drink alcohol or use illicit drugs.   No Known Allergies  Medications:  Prior to Admission medications   Medication Sig Start Date End Date Taking? Authorizing Provider  ARICEPT 10 MG tablet TAKE 1 TABLET EVERY EVENING  09/30/13  Yes York Spanielharles K Willis, MD  carbidopa-levodopa (SINEMET IR) 25-250 MG per tablet Take 1 tablet by mouth 3 (three) times daily. 06/29/14  Yes York Spanielharles K Willis, MD  clotrimazole-betamethasone (LOTRISONE) cream Apply 1 application topically as needed. 08/04/13  Yes Historical Provider, MD  fenofibrate 160 MG tablet Take 160 mg by mouth daily.   Yes Historical Provider, MD  lisinopril (PRINIVIL,ZESTRIL) 20 MG tablet Take 20 mg by mouth daily.     Yes Historical Provider, MD  OMEGA 3 1000 MG CAPS Take 1,000 mg by mouth 3 (three) times daily.     Yes Historical Provider, MD  Omega 3 1000 MG CAPS Take 3 capsules by mouth daily. 05/23/10  Yes Historical Provider, MD  polyethylene glycol (MIRALAX / GLYCOLAX) packet Take 17 g by mouth daily.   Yes Historical Provider, MD    ROS:  Out of a complete 14 system review of symptoms, the patient complains only of the following symptoms, and all other reviewed systems are negative.  Hearing loss Cough Excessive eating Constipation Sleep talking Urinary urgency Back pain, walking difficulty Memory loss, dizziness, weakness, tremors Decreased concentration  Blood pressure 141/89, pulse 53, height 6\' 2"  (1.88 m), weight 249 lb 12.8 oz (113.309 kg).  Physical Exam  General: The patient is alert and cooperative at  the time of the examination.  Skin: No significant peripheral edema is noted.   Neurologic Exam  Mental status: The Mini-Mental Status Examination done today shows a total score 27/30.   Cranial nerves: Facial symmetry is present. Speech is normal, no aphasia or dysarthria is noted. Extraocular movements are full. Visual fields are full. Masking of the face is seen.  Motor: The patient has good strength in all 4 extremities.  Sensory examination: Soft touch sensation is symmetric on the face, arms, and legs.  Coordination: The patient has good finger-nose-finger and heel-to-shin bilaterally. Prominent tremors are seen involving the  right greater than left upper extremities, and some on the right lower extremity.  Gait and station: The patient is able to get up from a seated position with arms crossed with some difficulty. Once up, the patient has a stooped posture, has freezing when initiating walking, and going through doors. He has good stride when walking down the hall straight. He has some difficulty with turns. Normally, he uses a cane with ambulation. Romberg is negative, no drift is seen. Tandem gait was not attempted.  Reflexes: Deep tendon reflexes are symmetric.   Assessment/Plan:  1. Parkinson's disease  2. Memory disturbance  3. Gait disturbance  The patient continues to have increasing problems with freezing. Once again I have suggested the use of selegiline, but the patient does not wish to go on another medication. The Sinemet prescription was called in, and he will follow-up in about 4 months. The patient will call me if he has problems in the future. He indicates that the [facility] may allow him service connection for the Parkinson's disease given a potential agent orange exposure previously.  [name] MD 06/29/2014 7:34 PM  [facility] [address] [place], [place] [address]  Phone [phone] Fax [phone]

## 2014-06-29 NOTE — Patient Instructions (Signed)

## 2014-06-30 ENCOUNTER — Encounter: Payer: Self-pay | Admitting: Podiatry

## 2014-06-30 ENCOUNTER — Ambulatory Visit (INDEPENDENT_AMBULATORY_CARE_PROVIDER_SITE_OTHER): Payer: Medicare Other | Admitting: Podiatry

## 2014-06-30 VITALS — BP 145/71 | HR 62

## 2014-06-30 DIAGNOSIS — M79606 Pain in leg, unspecified: Secondary | ICD-10-CM | POA: Diagnosis not present

## 2014-06-30 DIAGNOSIS — B351 Tinea unguium: Secondary | ICD-10-CM

## 2014-06-30 DIAGNOSIS — G20 Parkinson's disease: Secondary | ICD-10-CM

## 2014-06-30 NOTE — Progress Notes (Signed)
Subjective:  [age] year old male presents with wife requesting toe nails trimmed. He has tremor on right side and has difficulty walking.   Objective: Dermatologic: Thick yellow hypertrophic nails 1-5 right. Left side nails are normal.  Vascular: All pedal pulses are palpable. No edema or erythema noted.  Neurologic: Decreased response to Monofilament sensory testing bilateral.  Orthopedic: Rectus foot without gross deformities.   Assessment: Onychomycosis x 5 right.  Difficulty walking.  Pain in toes.   Plan: Palliation prn.  All nails debrided.

## 2014-06-30 NOTE — Patient Instructions (Signed)
Seen for hypertrophic nails. All nails debrided. Return in 3 months or as needed.  

## 2014-09-30 ENCOUNTER — Ambulatory Visit (INDEPENDENT_AMBULATORY_CARE_PROVIDER_SITE_OTHER): Payer: Medicare Other | Admitting: Podiatry

## 2014-09-30 ENCOUNTER — Encounter: Payer: Self-pay | Admitting: Podiatry

## 2014-09-30 ENCOUNTER — Telehealth: Payer: Self-pay

## 2014-09-30 VITALS — BP 141/73 | HR 62

## 2014-09-30 DIAGNOSIS — M79606 Pain in leg, unspecified: Secondary | ICD-10-CM | POA: Diagnosis not present

## 2014-09-30 DIAGNOSIS — B351 Tinea unguium: Secondary | ICD-10-CM

## 2014-09-30 NOTE — Patient Instructions (Signed)
Seen for hypertrophic nails. All nails debrided. Return in 3 months or as needed.  

## 2014-09-30 NOTE — Progress Notes (Signed)
Subjective:  80 year old male presents with wife requesting toe nails trimmed.  He has tremor on right side and has difficulty walking.  He also has hard of hearing.   Objective: Dermatologic: Thick yellow hypertrophic nails 1-5 right. Left side nails are normal.  Vascular: All pedal pulses are palpable. No edema or erythema noted.  Neurologic: Decreased response to Monofilament sensory testing bilateral.  Orthopedic: Rectus foot without gross deformities.   Assessment: Onychomycosis x 5 right.  Difficulty walking.  Pain in toes.   Plan: Palliation prn.  All nails debrided.

## 2014-09-30 NOTE — Telephone Encounter (Signed)
Left voicemail asking the patient to call back to r/s 7/7 appointment d/t Dr. Anne HahnWillis not being in the office that afternoon. Patient can be scheduled 7/25.

## 2014-10-01 NOTE — Telephone Encounter (Signed)
Appointment scheduled for 7/25 at 11 AM.

## 2014-10-08 ENCOUNTER — Ambulatory Visit: Payer: Medicare Other | Admitting: Neurology

## 2014-10-21 ENCOUNTER — Telehealth: Payer: Self-pay | Admitting: Neurology

## 2014-10-21 NOTE — Telephone Encounter (Signed)
Error

## 2014-10-26 ENCOUNTER — Ambulatory Visit (INDEPENDENT_AMBULATORY_CARE_PROVIDER_SITE_OTHER): Payer: Medicare Other | Admitting: Neurology

## 2014-10-26 ENCOUNTER — Encounter: Payer: Self-pay | Admitting: Neurology

## 2014-10-26 VITALS — BP 106/66 | HR 72 | Ht 74.0 in | Wt 246.4 lb

## 2014-10-26 DIAGNOSIS — M545 Low back pain: Secondary | ICD-10-CM

## 2014-10-26 DIAGNOSIS — R269 Unspecified abnormalities of gait and mobility: Secondary | ICD-10-CM

## 2014-10-26 DIAGNOSIS — G8929 Other chronic pain: Secondary | ICD-10-CM | POA: Insufficient documentation

## 2014-10-26 DIAGNOSIS — R413 Other amnesia: Secondary | ICD-10-CM

## 2014-10-26 DIAGNOSIS — G20 Parkinson's disease: Secondary | ICD-10-CM

## 2014-10-26 MED ORDER — TRAZODONE HCL 50 MG PO TABS: 50.0000 mg | ORAL_TABLET | Freq: Every evening | ORAL | Status: DC | PRN

## 2014-10-26 NOTE — Progress Notes (Signed)
Reason for visit: Parkinson's disease  Anthony Lambert is an 80 y.o. male  History of present illness:  Anthony Lambert is a 80 year old right-handed white male with a history of Parkinson's disease. The patient has had ongoing issues with gait instability, he reports some stumbles, but no falls. He has had some freezing episodes in the past. The patient has ongoing issues with chronic constipation, taking MiraLAX daily, with occasional Dulcolax tablets. Previously, Linzess resulted in diarrhea. The patient is having difficulty with ongoing chronic insomnia, he will sleep for 2-3 hours, then awaken, unable to get back to sleep. The patient takes naps throughout the day. He has chronic low back pain that is present with standing, better with sitting or lying down. He uses a cane for ambulation. He has tried a lumbar support device without benefit. He is not being treated for his low back pain, but this limits his ability to exercise. He has been relatively inactive. He returns to this office for further evaluation. The patient does have ongoing issues with memory, this has not worsened much since last seen. He is on Aricept for the memory, tolerating the medication well.  Past Medical History  Diagnosis Date  . Parkinson's disease   . Depression   . Dyslipidemia   . Hypertension   . Obesity   . Frozen shoulder     Left  . Memory disturbance   . REM sleep behavior disorder   . Chronic low back pain   . Macular degeneration   . Chronic renal insufficiency   . Shingles     Right partietal shingles  . HOH (hard of hearing)     bilateral hearing aids    Past Surgical History  Procedure Laterality Date  . Colonoscopy w/ polypectomy    . Dupuytren contracture release Bilateral     Family History  Problem Relation Age of Onset  . Cancer - Colon Mother   . Cancer Sister     Breast cancer  . Cancer - Prostate Brother     Social history:  reports that he has never smoked. He has never  used smokeless tobacco. He reports that he does not drink alcohol or use illicit drugs.   No Known Allergies  Medications:  Prior to Admission medications   Medication Sig Start Date End Date Taking? Authorizing Provider  ARICEPT 10 MG tablet TAKE 1 TABLET EVERY EVENING 09/30/13  Yes York Spaniel, MD  carbidopa-levodopa (SINEMET IR) 25-250 MG per tablet Take 1 tablet by mouth 3 (three) times daily. 06/29/14  Yes York Spaniel, MD  clotrimazole-betamethasone (LOTRISONE) cream Apply 1 application topically as needed. 08/04/13  Yes Historical Provider, MD  fenofibrate 160 MG tablet Take 160 mg by mouth daily.   Yes Historical Provider, MD  lisinopril (PRINIVIL,ZESTRIL) 20 MG tablet Take 20 mg by mouth daily.     Yes Historical Provider, MD  OMEGA 3 1000 MG CAPS Take 1,000 mg by mouth 3 (three) times daily.     Yes Historical Provider, MD  polyethylene glycol (MIRALAX / GLYCOLAX) packet Take 17 g by mouth daily.   Yes Historical Provider, MD    ROS:  Out of a complete 14 system review of symptoms, the patient complains only of the following symptoms, and all other reviewed systems are negative.  Fatigue Hearing loss Cough Constipation Insomnia, frequent waking, daytime sleepiness Back pain, muscle cramps, walking difficulty  Blood pressure 106/66, pulse 72, height 6\' 2"  (1.88 m), weight 246 lb 6.4 oz (  111.766 kg).  Physical Exam  General: The patient is alert and cooperative at the time of the examination. The patient is moderately obese.  Skin: No significant peripheral edema is noted.   Neurologic Exam  Mental status: The patient is alert and oriented x 2 at the time of the examination (not oriented to date). The patient has apparent normal recent and remote memory, with an apparently normal attention span and concentration ability. Mini-Mental Status Examination done today shows a total score 27/30. The patient is able to name 8 animals in 30 seconds.   Cranial nerves:  Facial symmetry is present. Speech is normal, no aphasia or dysarthria is noted. Extraocular movements are full. Visual fields are full. Masking of the face is seen.  Motor: The patient has good strength in all 4 extremities.  Sensory examination: Soft touch sensation is symmetric on the face, arms, and legs.  Coordination: The patient has good finger-nose-finger and heel-to-shin bilaterally. Prominent resting tremors are seen on all 4 extremities.  Gait and station: The patient is able to arise from a seated position with arms crossed. Once up, the patient has a stooped posture, walks with a rapid pace, some difficulty with turns. Decreased arm swing. Tandem gait was not attempted. Romberg is negative. The patient normally walks with a cane.  Reflexes: Deep tendon reflexes are symmetric.   Assessment/Plan:  1. Parkinson's disease  2. Gait disorder  3. Chronic insomnia  4. Chronic low back pain  5. Chronic constipation  The patient is having sleeping problems related to the Parkinson's disease, he does not snore at night according to the wife. We will give a trial on trazodone for this. The patient is not sure that he wants any treatment for evaluation for the low back pain, the patient has pain with standing, better with sitting, no radiation down the legs. This appears to be consistent with facet joint arthritis. The Sinemet dosing will be continued taking 1 tablet 3 times daily. He will return in about 4 months. He will contact our office if he does desire to have some therapy for his back pain.  [name] MD 10/26/2014 7:33 PM  [facility] [address] [place], [place] [address]  Phone [phone] Fax [phone]

## 2014-10-26 NOTE — Patient Instructions (Addendum)
We will continue the Sinemet (carbidopa) one tablet 3 times daily. We will try trazodone at night for sleep, if you need a dose adjustment of this medication, please do not hesitate to call me. If you decide you wish to pursue treatment of the low back pain, please call our office. Otherwise, we will follow-up in about 4 months.   Back Pain, Adult Low back pain is very common. About 1 in 5 people have back pain.The cause of low back pain is rarely dangerous. The pain often gets better over time.About half of people with a sudden onset of back pain feel better in just 2 weeks. About 8 in 10 people feel better by 6 weeks.  CAUSES Some common causes of back pain include:  Strain of the muscles or ligaments supporting the spine.  Wear and tear (degeneration) of the spinal discs.  Arthritis.  Direct injury to the back. DIAGNOSIS Most of the time, the direct cause of low back pain is not known.However, back pain can be treated effectively even when the exact cause of the pain is unknown.Answering your caregiver's questions about your overall health and symptoms is one of the most accurate ways to make sure the cause of your pain is not dangerous. If your caregiver needs more information, he or she may order lab work or imaging tests (X-rays or MRIs).However, even if imaging tests show changes in your back, this usually does not require surgery. HOME CARE INSTRUCTIONS For many people, back pain returns.Since low back pain is rarely dangerous, it is often a condition that people can learn to [facility] their own.   Remain active. It is stressful on the back to sit or stand in one place. Do not sit, drive, or stand in one place for more than 30 minutes at a time. Take short walks on level surfaces as soon as pain allows.Try to increase the length of time you walk each day.  Do not stay in bed.Resting more than 1 or 2 days can delay your recovery.  Do not avoid exercise or work.Your body is  made to move.It is not dangerous to be active, even though your back may hurt.Your back will likely heal faster if you return to being active before your pain is gone.  Pay attention to your body when you bend and lift. Many people have less discomfortwhen lifting if they bend their knees, keep the load close to their bodies,and avoid twisting. Often, the most comfortable positions are those that put less stress on your recovering back.  Find a comfortable position to sleep. Use a firm mattress and lie on your side with your knees slightly bent. If you lie on your back, put a pillow under your knees.  Only take over-the-counter or prescription medicines as directed by your caregiver. Over-the-counter medicines to reduce pain and inflammation are often the most helpful.Your caregiver may prescribe muscle relaxant drugs.These medicines help dull your pain so you can more quickly return to your normal activities and healthy exercise.  Put ice on the injured area.  Put ice in a plastic bag.  Place a towel between your skin and the bag.  Leave the ice on for 15-20 minutes, 03-04 times a day for the first 2 to 3 days. After that, ice and heat may be alternated to reduce pain and spasms.  Ask your caregiver about trying back exercises and gentle massage. This may be of some benefit.  Avoid feeling anxious or stressed.Stress increases muscle tension and can  worsen back pain.It is important to recognize when you are anxious or stressed and learn ways to manage it.Exercise is a great option. SEEK MEDICAL CARE IF:  You have pain that is not relieved with rest or medicine.  You have pain that does not improve in 1 week.  You have new symptoms.  You are generally not feeling well. SEEK IMMEDIATE MEDICAL CARE IF:   You have pain that radiates from your back into your legs.  You develop new bowel or bladder control problems.  You have unusual weakness or numbness in your arms or  legs.  You develop nausea or vomiting.  You develop abdominal pain.  You feel faint. Document Released: 03/20/2005 Document Revised: 09/19/2011 Document Reviewed: 07/22/2013 [facility] Patient Information 2015 [employer] [place]. This information is not intended to replace advice given to you by your health care provider. Make sure you discuss any questions you have with your health care provider.

## 2014-11-16 ENCOUNTER — Other Ambulatory Visit: Payer: Self-pay | Admitting: Neurology

## 2014-11-16 NOTE — Telephone Encounter (Signed)
error 

## 2014-12-30 ENCOUNTER — Ambulatory Visit (INDEPENDENT_AMBULATORY_CARE_PROVIDER_SITE_OTHER): Payer: Medicare Other | Admitting: Podiatry

## 2014-12-30 ENCOUNTER — Encounter: Payer: Self-pay | Admitting: Podiatry

## 2014-12-30 DIAGNOSIS — M79606 Pain in leg, unspecified: Secondary | ICD-10-CM

## 2014-12-30 DIAGNOSIS — B351 Tinea unguium: Secondary | ICD-10-CM

## 2014-12-30 NOTE — Patient Instructions (Signed)
Seen for hypertrophic nails. All nails debrided. Return in 3 months or as needed.  

## 2014-12-30 NOTE — Progress Notes (Signed)
Subjective:  80 year old male presents with wife requesting toe nails trimmed. He has Parkinson's disease.  He has tremor on right side and has difficulty walking. He also has hard of hearing.  Stated that the 2nd toe left is numb and can't feel at all.  Objective: Dermatologic: Thick yellow hypertrophic nails 1-5 right. Left side nails are normal.  Vascular: All pedal pulses are palpable. No edema or erythema noted.  Neurologic: Decreased response to Monofilament sensory testing bilateral.  Orthopedic: Rectus foot without gross deformities.   Assessment: Onychomycosis x 5 right.  Difficulty walking.  Pain in toes.   Plan: Palliation prn.  All nails debrided.

## 2015-01-18 ENCOUNTER — Other Ambulatory Visit: Payer: Self-pay | Admitting: Neurology

## 2015-03-03 ENCOUNTER — Telehealth: Payer: Self-pay | Admitting: Neurology

## 2015-03-03 ENCOUNTER — Encounter: Payer: Self-pay | Admitting: Neurology

## 2015-03-03 ENCOUNTER — Ambulatory Visit (INDEPENDENT_AMBULATORY_CARE_PROVIDER_SITE_OTHER): Payer: Medicare Other | Admitting: Neurology

## 2015-03-03 VITALS — BP 107/57 | HR 71 | Ht 74.0 in | Wt 240.0 lb

## 2015-03-03 DIAGNOSIS — G20 Parkinson's disease: Secondary | ICD-10-CM

## 2015-03-03 DIAGNOSIS — M545 Low back pain: Secondary | ICD-10-CM | POA: Diagnosis not present

## 2015-03-03 DIAGNOSIS — R269 Unspecified abnormalities of gait and mobility: Secondary | ICD-10-CM

## 2015-03-03 DIAGNOSIS — G8929 Other chronic pain: Secondary | ICD-10-CM

## 2015-03-03 DIAGNOSIS — R413 Other amnesia: Secondary | ICD-10-CM

## 2015-03-03 MED ORDER — CARBIDOPA-LEVODOPA 25-250 MG PO TABS: 1.0000 | ORAL_TABLET | Freq: Four times a day (QID) | ORAL | Status: DC

## 2015-03-03 MED ORDER — TRAZODONE HCL 100 MG PO TABS: 100.0000 mg | ORAL_TABLET | Freq: Every day | ORAL | Status: DC

## 2015-03-03 NOTE — Progress Notes (Signed)
Reason for visit: Parkinson's disease  Anthony Lambert is an 80 y.o. male  History of present illness:  Anthony Lambert is a 80 year old right-handed white male with a history of Parkinson's disease associated with a gait disorder. He has a memory disturbance as well, he is on Aricept for this. He is tolerating the medication well, without any significant weight loss or diarrhea. The patient has significant tremors on all 4 extremities, he is having increasing difficulty feeding himself because of this. The patient has been on Sinemet taking the 25/250 mg dosing 3 times daily at 9 AM, 3 PM, and 10 PM. He has had issues with freezing at times, particularly when going through a door. The patient has fallen on one occasion since last seen, the last fall was one month ago. He seems to have difficulty sleeping at night, he will nap on and off throughout the day and night. He has no regular day and night sleep-wake cycle. The patient is trying to get some disability through the [facility] for [facility] and its relationship to Parkinson's disease. He has been denied on one occasion. He returns for an evaluation.  Past Medical History  Diagnosis Date  . Parkinson's disease (HCC)   . Depression   . Dyslipidemia   . Hypertension   . Obesity   . Frozen shoulder     Left  . Memory disturbance   . REM sleep behavior disorder   . Chronic low back pain   . Macular degeneration   . Chronic renal insufficiency   . Shingles     Right partietal shingles  . HOH (hard of hearing)     bilateral hearing aids    Past Surgical History  Procedure Laterality Date  . Colonoscopy w/ polypectomy    . Dupuytren contracture release Bilateral     Family History  Problem Relation Age of Onset  . Cancer - Colon Mother   . Cancer Sister     Breast cancer  . Cancer - Prostate Brother     Social history:  reports that he has never smoked. He has never used smokeless tobacco. He reports that he does not drink  alcohol or use illicit drugs.   No Known Allergies  Medications:  Prior to Admission medications   Medication Sig Start Date End Date Taking? Authorizing Provider  carbidopa-levodopa (SINEMET IR) 25-250 MG per tablet Take 1 tablet by mouth 3 (three) times daily. 06/29/14  Yes York Spaniel, MD  clotrimazole-betamethasone (LOTRISONE) cream Apply 1 application topically as needed. 08/04/13  Yes Historical Provider, MD  donepezil (ARICEPT) 10 MG tablet TAKE 1 TABLET EVERY EVENING 11/16/14  Yes York Spaniel, MD  fenofibrate 160 MG tablet Take 160 mg by mouth daily.   Yes Historical Provider, MD  lisinopril (PRINIVIL,ZESTRIL) 20 MG tablet Take 20 mg by mouth daily.     Yes Historical Provider, MD  OMEGA 3 1000 MG CAPS Take 1,000 mg by mouth 3 (three) times daily.     Yes Historical Provider, MD  polyethylene glycol (MIRALAX / GLYCOLAX) packet Take 17 g by mouth daily.   Yes Historical Provider, MD  traZODone (DESYREL) 50 MG tablet TAKE 1 TABLET BY MOUTH AT BEDTIME AS NEEDED FOR SLEEP 01/18/15  Yes York Spaniel, MD  vitamin B-12 (CYANOCOBALAMIN) 1000 MCG tablet Take 1,000 mcg by mouth daily.    Historical Provider, MD    ROS:  Out of a complete 14 system review of symptoms, the patient complains  only of the following symptoms, and all other reviewed systems are negative.  Fatigue Hearing loss Eye redness, blurred vision Cold intolerance Constipation, rectal pain Restless legs, frequent waking, daytime sleepiness Difficulty urinating Back pain, walking difficulty Moles Confusion, depression  Blood pressure 107/57, pulse 71, height 6\' 2"  (1.88 m), weight 240 lb (108.863 kg).  Physical Exam  General: The patient is alert and cooperative at the time of the examination.  Skin: No significant peripheral edema is noted.   Neurologic Exam  Mental status: The patient is alert and oriented x 2 at the time of the examination (not oriented to date). The Mini-Mental Status Examination  done today shows a total score 24/30. The patient is able to name 11 animals in 30 seconds.   Cranial nerves: Facial symmetry is present. Speech is dysphonic, not aphasic. Extraocular movements are full. Visual fields are full. Masking of the face is seen.  Motor: The patient has good strength in all 4 extremities.  Sensory examination: Soft touch sensation is symmetric on the face, arms, and legs.  Coordination: The patient has good finger-nose-finger and heel-to-shin bilaterally. Prominent resting tremors are noted on all 4 extremities.  Gait and station: The patient is able to arise from a seated position with arms crossed. Once up, he has a stooped posture, he is able to ambulate without assistance, usually uses a cane. He has decreased arm swing, good stride, fair turns. At times, he may freeze. No dyskinesias are seen. Tandem gait is relatively stable, Romberg is negative.  Reflexes: Deep tendon reflexes are symmetric.   Assessment/Plan:  1. Parkinson's disease  2. Memory disturbance  3. Gait disturbance  4. Chronic low back pain  5. Excessive daytime drowsiness  The patient transiently gained benefit from trazodone for sleep at night at 50 mg. We will go up to 100 mg at night. The Sinemet will also be increased taking the 25/250 mg tablets 4 times daily at around 8 AM, 11 or 12, 5 PM, and at 10 PM. The patient will follow-up in 4 months. The patient is encouraged to remain active. The back pain seems to limit his ability to perform daily physical activity.  [name]. Keith Elektra Wartman MD 03/04/2015 9:27 AM  [facility] [address] [place], [place] [id number]  Phone [phone] Fax [phone]

## 2015-03-03 NOTE — Telephone Encounter (Signed)
Anthony Lambert [phone] called to advise that patient was seen this morning, Dr. Anne HahnWillis sent Rx for carbidopa-levodopa (SINEMET IR) 25-[facility] [address] and [facility]

## 2015-03-03 NOTE — Telephone Encounter (Signed)
I called the patient's wife. [facility] and confirmation was received at 12:50. She thanked me for calling.

## 2015-03-03 NOTE — Patient Instructions (Signed)
Fall Prevention in the Home  Falls can cause injuries and can affect people from all age groups. There are many simple things that you can do to make your home safe and to help prevent falls. WHAT CAN I DO ON THE OUTSIDE OF MY HOME?  Regularly repair the edges of walkways and driveways and fix any cracks.  Remove high doorway thresholds.  Trim any shrubbery on the main path into your home.  Use bright outdoor lighting.  Clear walkways of debris and clutter, including tools and rocks.  Regularly check that handrails are securely fastened and in good repair. Both sides of any steps should have handrails.  Install guardrails along the edges of any raised decks or porches.  Have leaves, snow, and ice cleared regularly.  Use sand or salt on walkways during winter months.  In the garage, clean up any spills right away, including grease or oil spills. WHAT CAN I DO IN THE BATHROOM?  Use night lights.  Install grab bars by the toilet and in the tub and shower. Do not use towel bars as grab bars.  Use non-skid mats or decals on the floor of the tub or shower.  If you need to sit down while you are in the shower, use a plastic, non-slip stool..  Keep the floor dry. Immediately clean up any water that spills on the floor.  Remove soap buildup in the tub or shower on a regular basis.  Attach bath mats securely with double-sided non-slip rug tape.  Remove throw rugs and other tripping hazards from the floor. WHAT CAN I DO IN THE BEDROOM?  Use night lights.  Make sure that a bedside light is easy to reach.  Do not use oversized bedding that drapes onto the floor.  Have a firm chair that has side arms to use for getting dressed.  Remove throw rugs and other tripping hazards from the floor. WHAT CAN I DO IN THE KITCHEN?   Clean up any spills right away.  Avoid walking on wet floors.  Place frequently used items in easy-to-reach places.  If you need to reach for something  above you, use a sturdy step stool that has a grab bar.  Keep electrical cables out of the way.  Do not use floor polish or wax that makes floors slippery. If you have to use wax, make sure that it is non-skid floor wax.  Remove throw rugs and other tripping hazards from the floor. WHAT CAN I DO IN THE STAIRWAYS?  Do not leave any items on the stairs.  Make sure that there are handrails on both sides of the stairs. Fix handrails that are broken or loose. Make sure that handrails are as long as the stairways.  Check any carpeting to make sure that it is firmly attached to the stairs. Fix any carpet that is loose or worn.  Avoid having throw rugs at the top or bottom of stairways, or secure the rugs with carpet tape to prevent them from moving.  Make sure that you have a light switch at the top of the stairs and the bottom of the stairs. If you do not have them, have them installed. WHAT ARE SOME OTHER FALL PREVENTION TIPS?  Wear closed-toe shoes that fit well and support your feet. Wear shoes that have rubber soles or low heels.  When you use a stepladder, make sure that it is completely opened and that the sides are firmly locked. Have someone hold the ladder while you   are using it. Do not climb a closed stepladder.  Add color or contrast paint or tape to grab bars and handrails in your home. Place contrasting color strips on the first and last steps.  Use mobility aids as needed, such as canes, walkers, scooters, and crutches.  Turn on lights if it is dark. Replace any light bulbs that burn out.  Set up furniture so that there are clear paths. Keep the furniture in the same spot.  Fix any uneven floor surfaces.  Choose a carpet design that does not hide the edge of steps of a stairway.  Be aware of any and all pets.  Review your medicines with your healthcare provider. Some medicines can cause dizziness or changes in blood pressure, which increase your risk of falling. Talk  with your health care provider about other ways that you can decrease your risk of falls. This may include working with a physical therapist or trainer to improve your strength, balance, and endurance.   This information is not intended to replace advice given to you by your health care provider. Make sure you discuss any questions you have with your health care provider.   Document Released: 03/10/2002 Document Revised: 08/04/2014 Document Reviewed: 04/24/2014 Elsevier Interactive Patient Education 2016 Elsevier Inc.  

## 2015-03-12 ENCOUNTER — Telehealth: Payer: Self-pay | Admitting: Neurology

## 2015-03-12 ENCOUNTER — Other Ambulatory Visit: Payer: Self-pay

## 2015-03-12 MED ORDER — TRAZODONE HCL 100 MG PO TABS: 100.0000 mg | ORAL_TABLET | Freq: Every day | ORAL | Status: DC

## 2015-03-12 NOTE — Telephone Encounter (Signed)
Pt's wife called requesting refill for traZODone (DESYREL) 100 MG [facility]

## 2015-03-12 NOTE — Telephone Encounter (Signed)
Rx for Trazodone sent to [facility] with note that it was last filled [facility]/11/15[facility]. [facility] also [facility] all [facility] (spoke to [facility]).

## 2015-03-17 ENCOUNTER — Other Ambulatory Visit: Payer: Self-pay

## 2015-03-17 MED ORDER — TRAZODONE HCL 100 MG PO TABS: 100.0000 mg | ORAL_TABLET | Freq: Every day | ORAL | Status: DC

## 2015-03-31 ENCOUNTER — Ambulatory Visit: Payer: Medicare Other | Admitting: Podiatry

## 2015-04-02 ENCOUNTER — Encounter: Payer: Self-pay | Admitting: Podiatry

## 2015-04-02 ENCOUNTER — Ambulatory Visit (INDEPENDENT_AMBULATORY_CARE_PROVIDER_SITE_OTHER): Payer: Medicare Other | Admitting: Podiatry

## 2015-04-02 DIAGNOSIS — M79606 Pain in leg, unspecified: Secondary | ICD-10-CM | POA: Diagnosis not present

## 2015-04-02 DIAGNOSIS — B351 Tinea unguium: Secondary | ICD-10-CM | POA: Diagnosis not present

## 2015-04-02 DIAGNOSIS — G20 Parkinson's disease: Secondary | ICD-10-CM

## 2015-04-02 DIAGNOSIS — G20A1 Parkinson's disease without dyskinesia, without mention of fluctuations: Secondary | ICD-10-CM

## 2015-04-02 NOTE — Progress Notes (Signed)
Subjective:  [age] year old male presents with wife requesting toe nails trimmed. He has Parkinson's disease.  He has tremor on right side and has difficulty walking. He also has hard of hearing.   Objective: Dermatologic: Thick yellow hypertrophic nails 1-5 right. Left side nails are normal.  Vascular: All pedal pulses are palpable. No edema or erythema noted.  Neurologic: Decreased response to Monofilament sensory testing bilateral.  Orthopedic: Rectus foot without gross deformities.   Assessment: Onychomycosis x 5 right.  Difficulty walking.  Pain in toes.   Plan: Palliation prn.  All nails debrided.

## 2015-04-02 NOTE — Patient Instructions (Signed)
Seen for hypertrophic nails. All nails debrided. Return in 3 months or as needed.  

## 2015-05-17 DIAGNOSIS — Z9181 History of falling: Secondary | ICD-10-CM | POA: Insufficient documentation

## 2015-05-26 DIAGNOSIS — N3942 Incontinence without sensory awareness: Secondary | ICD-10-CM | POA: Insufficient documentation

## 2015-05-26 DIAGNOSIS — B356 Tinea cruris: Secondary | ICD-10-CM | POA: Insufficient documentation

## 2015-06-30 ENCOUNTER — Encounter: Payer: Self-pay | Admitting: Adult Health

## 2015-06-30 ENCOUNTER — Ambulatory Visit (INDEPENDENT_AMBULATORY_CARE_PROVIDER_SITE_OTHER): Payer: Medicare Other | Admitting: Adult Health

## 2015-06-30 VITALS — BP 102/64 | HR 72 | Resp 14 | Ht 74.0 in | Wt 213.4 lb

## 2015-06-30 DIAGNOSIS — G20 Parkinson's disease: Secondary | ICD-10-CM | POA: Diagnosis not present

## 2015-06-30 DIAGNOSIS — R269 Unspecified abnormalities of gait and mobility: Secondary | ICD-10-CM | POA: Diagnosis not present

## 2015-06-30 DIAGNOSIS — R413 Other amnesia: Secondary | ICD-10-CM

## 2015-06-30 NOTE — Patient Instructions (Signed)
Continue Sinemet Referral for therapy  If your symptoms worsen or you develop new symptoms please let us know.

## 2015-06-30 NOTE — Progress Notes (Signed)
PATIENT: Anthony Lambert DOB: [birth year]  REASON FOR VISIT: follow up- Parkinson's disease HISTORY FROM: patient  HISTORY OF PRESENT ILLNESS: Anthony Lambert is a 81 year old male with a history of Parkinson's disease associated with a gait disorder and memory disorder. He returns today for follow-up. The patient is currently taking Sinemet 25-100 milligrams 4 times a day. The patient reports that he may have had slight improvement with the increase in Sinemet. He continues to have a tremor that primarily affects the right hand but has been noted in all 4 extremities. The patient reports that he's been falling frequently. He does not use any assistive device when ambulating. He denies any problems swallowing. The patient continues to use trazodone for sleep. It offers him minimal benefit. He states that he tends to sleep in the afternoon which causes him to be up at night. The patient requires assistance with most ADLs. The patient has also noticed some issues with constipation. The patient feels that his memory has remained stable. He has continued on Aricept. He returns today for an evaluation.  HISTORY 03/03/2015 Anthony Lambert ): Anthony Lambert is a 81 year old right-handed white male with a history of Parkinson's disease associated with a gait disorder. He has a memory disturbance as well, he is on Aricept for this. He is tolerating the medication well, without any significant weight loss or diarrhea. The patient has significant tremors on all 4 extremities, he is having increasing difficulty feeding himself because of this. The patient has been on Sinemet taking the 25/250 mg dosing 3 times daily at 9 AM, 3 PM, and 10 PM. He has had issues with freezing at times, particularly when going through a door. The patient has fallen on one occasion since last seen, the last fall was one month ago. He seems to have difficulty sleeping at night, he will nap on and off throughout the day and night. He has no regular day and  night sleep-wake cycle. The patient is trying to get some disability through the [facility] for [facility] and its relationship to Parkinson's disease. He has been denied on one occasion. He returns for an evaluation.  REVIEW OF SYSTEMS: Out of a complete 14 system review of symptoms, the patient complains only of the following symptoms, and all other reviewed systems are negative.  Constipation, frequently taking, daytime sleepiness, cold intolerance, difficulty urinating  ALLERGIES: No Known Allergies  HOME MEDICATIONS: Outpatient Prescriptions Prior to Visit  Medication Sig Dispense Refill  . carbidopa-levodopa (SINEMET IR) 25-250 MG tablet Take 1 tablet by mouth 4 (four) times daily. 360 tablet 2  . clotrimazole-betamethasone (LOTRISONE) cream Apply 1 application topically as needed.    . donepezil (ARICEPT) 10 MG tablet TAKE 1 TABLET EVERY EVENING 90 tablet 3  . fenofibrate 160 MG tablet Take 160 mg by mouth daily.    Marland Kitchen lisinopril (PRINIVIL,ZESTRIL) 20 MG tablet Take 20 mg by mouth daily.      . OMEGA 3 1000 MG CAPS Take 1,000 mg by mouth 3 (three) times daily.      . polyethylene glycol (MIRALAX / GLYCOLAX) packet Take 17 g by mouth daily.    . traZODone (DESYREL) 100 MG tablet Take 1 tablet (100 mg total) by mouth at bedtime. 90 tablet 1  . vitamin B-12 (CYANOCOBALAMIN) 1000 MCG tablet Take 1,000 mcg by mouth daily.     No facility-administered medications prior to visit.    PAST MEDICAL HISTORY: Past Medical History  Diagnosis Date  . Parkinson's disease (HCC)   .  Depression   . Dyslipidemia   . Hypertension   . Obesity   . Frozen shoulder     Left  . Memory disturbance   . REM sleep behavior disorder   . Chronic low back pain   . Macular degeneration   . Chronic renal insufficiency   . Shingles     Right partietal shingles  . HOH (hard of hearing)     bilateral hearing aids    PAST SURGICAL HISTORY: Past Surgical History  Procedure Laterality Date  .  Colonoscopy w/ polypectomy    . Dupuytren contracture release Bilateral     FAMILY HISTORY: Family History  Problem Relation Age of Onset  . Cancer - Colon Mother   . Cancer Sister     Breast cancer  . Cancer - Prostate Brother     SOCIAL HISTORY: Social History   Social History  . Marital Status: Married    Spouse Name: N/A  . Number of Children: 2  . Years of Education: college   Occupational History  . Retired    Social History Main Topics  . Smoking status: Never Smoker   . Smokeless tobacco: Never Used  . Alcohol Use: No     Comment: Consumes beer on occasion  . Drug Use: No  . Sexual Activity: Not on file   Other Topics Concern  . Not on file   Social History Narrative   Patient is right handed.   Patient does not drink caffeine.      PHYSICAL EXAM  Filed Vitals:   06/30/15 1445  BP: 102/64  Pulse: 72  Resp: 14  Height: 6\' 2"  (1.88 m)  Weight: 213 lb 6.4 oz (96.798 kg)   Body mass index is 27.39 kg/(m^2).  MMSE - Mini Mental State Exam 03/03/2015 10/26/2014 06/29/2014  Orientation to time 4 3 4   Orientation to Place 4 5 4   Registration 3 3 3   Attention/ Calculation 2 5 5   Recall 2 3 3   Language- name 2 objects 2 2 2   Language- repeat 1 1 1   Language- follow 3 step command 3 3 2   Language- read & follow direction 1 1 1   Write a sentence 1 0 1  Copy design 1 1 1   Total score 24 27 27      Generalized: Well developed, in no acute distress   Neurological examination  Mentation: Alert . Follows all commands speech and language fluent Cranial nerve II-XII: Pupils were equal round reactive to light. Extraocular movements were full, visual field were full on confrontational test. Facial sensation and strength were normal. Uvula tongue midline. Head turning and shoulder shrug  were normal and symmetric. Motor: The motor testing reveals 5 over 5 strength of all 4 extremities. Good symmetric motor tone is noted throughout. Resting tremor noted in  right hand and right leg. Sensory: Sensory testing is intact to soft touch on all 4 extremities. No evidence of extinction is noted.  Coordination: Cerebellar testing reveals good finger-nose-finger and heel-to-shin bilaterally.  Gait and station: Patient is able to stand from a sitting position without assistance. The patient does have a shuffling gait. His gait is very fast pace. He has good arm swing. Tandem gait not attempted. Reflexes: Deep tendon reflexes are symmetric and normal bilaterally.   DIAGNOSTIC DATA (LABS, IMAGING, TESTING) - I reviewed patient records, labs, notes, testing and imaging myself where available.      ASSESSMENT AND PLAN 81 y.o. year old male  has a past medical  history of Parkinson's disease (HCC); Depression; Dyslipidemia; Hypertension; Obesity; Frozen shoulder; Memory disturbance; REM sleep behavior disorder; Chronic low back pain; Macular degeneration; Chronic renal insufficiency; Shingles; and HOH (hard of hearing). here with:  1. Parkinson's disease 2. Abnormality of gait 3. Memory disturbance  The patient will remain on Sinemet 25-100 milligrams 4 times a day. The patient has had frequent falls. I do feel that he would benefit from some physical therapy. They are amenable to this. A referral has been placed. The patient will remain on Aricept for his memory. Patient has been advised that if his symptoms worsen or he develops any new symptoms he should let us know. He will follow-up in 6 months with Dr. Anne Lambert.   Butch Penny, MSN, NP-C 06/30/2015, 3:09 PM [facility] [address] [place], [place] [address] [phone]

## 2015-06-30 NOTE — Progress Notes (Signed)
I have read the note, and I agree with the clinical assessment and plan.  Camillo Quadros KEITH   

## 2015-07-01 ENCOUNTER — Ambulatory Visit: Payer: Medicare Other | Admitting: Adult Health

## 2015-07-05 ENCOUNTER — Encounter: Payer: Self-pay | Admitting: Podiatry

## 2015-07-05 ENCOUNTER — Ambulatory Visit (INDEPENDENT_AMBULATORY_CARE_PROVIDER_SITE_OTHER): Payer: Medicare Other | Admitting: Podiatry

## 2015-07-05 DIAGNOSIS — M79606 Pain in leg, unspecified: Secondary | ICD-10-CM

## 2015-07-05 DIAGNOSIS — G20A1 Parkinson's disease without dyskinesia, without mention of fluctuations: Secondary | ICD-10-CM

## 2015-07-05 DIAGNOSIS — B351 Tinea unguium: Secondary | ICD-10-CM | POA: Diagnosis not present

## 2015-07-05 DIAGNOSIS — G20 Parkinson's disease: Secondary | ICD-10-CM | POA: Diagnosis not present

## 2015-07-05 NOTE — Patient Instructions (Signed)
Seen for hypertrophic nails. All nails debrided. Return in 3 months or as needed.  

## 2015-07-05 NOTE — Progress Notes (Signed)
Subjective:  81 year old male presents with wife requesting toe nails trimmed. He has Parkinson's disease.  He has tremor on right side and has difficulty walking. He also has hard of hearing.   Objective: Dermatologic: Thick yellow hypertrophic nails 1-5 right.  Left side nails are normal color and thickness, but hypertrophic.  Vascular: All pedal pulses are palpable. No edema or erythema noted.  Neurologic: Decreased response to Monofilament sensory testing bilateral.  Orthopedic: Rectus foot without gross deformities.   Assessment: Onychomycosis x 5 right.  Difficulty walking.  Pain in lower limbs  Plan: Palliation prn.  All nails debrided.

## 2015-07-07 ENCOUNTER — Telehealth: Payer: Self-pay | Admitting: Neurology

## 2015-07-07 NOTE — Telephone Encounter (Signed)
Pt is to have a colonoscopy on Monday and is going to miss 2 doses of carbidopa-levodopa (SINEMET IR) 25-250 MG tablet. Are there any adjustments that need to be made. Please call [phone]

## 2015-07-07 NOTE — Telephone Encounter (Signed)
The patient will be having a colonoscopy in the morning, he can take his medication in the evening, he may miss up to 2 doses of the Sinemet, this should not cause too much of a problem, he may freeze up a little bit before he can get the dose of Sinemet in.

## 2015-07-26 ENCOUNTER — Telehealth: Payer: Self-pay | Admitting: Neurology

## 2015-07-26 NOTE — Telephone Encounter (Signed)
Tasia CatchingsCraig with [facility] is calling and states he has been working with patient this week and feels he needs additional 6 week PT, 2 X week for balance and strength. He also suggests occupational health be added and a home health aide to help his wife with bathing, etc.  Please call order to him [phone].

## 2015-07-26 NOTE — Telephone Encounter (Signed)
Returned call to [place] w/ VO to continue PT and add OT and HHA.

## 2015-08-27 ENCOUNTER — Other Ambulatory Visit: Payer: Self-pay | Admitting: Neurology

## 2015-09-02 NOTE — Telephone Encounter (Signed)
Returned TC to E[place]raig and gave VO to extend PT for 4 more weeks. He says that pt is "getting there" but could use additional therapy.

## 2015-09-02 NOTE — Telephone Encounter (Addendum)
Tasia Catchingsraig with [facility] is calling to get a verbal order for the patient to continue PT 2 x a week for 4 weeks. Please call and advise.

## 2015-09-07 NOTE — Telephone Encounter (Signed)
Pt's wife called requesting extension for home health for bathing, etc-preferably the same health aid-[facility]. Please call

## 2015-09-07 NOTE — Telephone Encounter (Signed)
Sp[facility] to [place], [facility], who says that pt will be finishing up his 4 wk extension and then will require re-eval for continued therapy. He agreed to [facility] until that time. Pt's wife notified via TC. Verbalized understanding [facility] to set up [facility]. Voiced appreciation for call.

## 2015-09-14 ENCOUNTER — Telehealth: Payer: Self-pay | Admitting: Neurology

## 2015-09-14 NOTE — Telephone Encounter (Signed)
Craig/[facility] PT [phone] called to see if Dr. Anne HahnWillis will certify continuation of HH PT 2 x week for 6 more weeks, "to help get patient on his feet a little more". Would like this by the end of the week. Is aware Dr. Anne HahnWillis is out of the office until Monday.

## 2015-09-14 NOTE — Telephone Encounter (Signed)
Ok per Dr. Terrace ArabiaYan to extend PT.  Returned call to [place]ra[place] and provided verbal orders.

## 2015-10-11 ENCOUNTER — Ambulatory Visit (INDEPENDENT_AMBULATORY_CARE_PROVIDER_SITE_OTHER): Payer: Medicare Other | Admitting: Podiatry

## 2015-10-11 ENCOUNTER — Ambulatory Visit: Payer: Medicare Other | Admitting: Podiatry

## 2015-10-11 ENCOUNTER — Encounter: Payer: Self-pay | Admitting: Podiatry

## 2015-10-11 VITALS — BP 126/66 | HR 60

## 2015-10-11 DIAGNOSIS — G20 Parkinson's disease: Secondary | ICD-10-CM | POA: Diagnosis not present

## 2015-10-11 DIAGNOSIS — B351 Tinea unguium: Secondary | ICD-10-CM | POA: Diagnosis not present

## 2015-10-11 DIAGNOSIS — M79606 Pain in leg, unspecified: Secondary | ICD-10-CM

## 2015-10-11 NOTE — Patient Instructions (Signed)
Seen for hypertrophic nails. All nails debrided. Return in 3 months or as needed.  

## 2015-10-11 NOTE — Progress Notes (Signed)
Subjective:  81 year old male presents with wife requesting toe nails trimmed.  He has Parkinson's disease with tremor on right side and has difficulty walking. He also has hard of hearing.   Objective: Dermatologic: Thick yellow hypertrophic nails 1-5 right.  Left side nails are normal color and thickness, but hypertrophic.  Vascular: All pedal pulses are palpable. No edema or erythema noted.  Neurologic: Decreased response to Monofilament sensory testing bilateral.  Orthopedic: Rectus foot without gross deformities.   Assessment: Onychomycosis x 5 right.  Difficulty walking.  Pain in lower limbs  Plan: Palliation prn.  All nails debrided.

## 2015-11-11 ENCOUNTER — Other Ambulatory Visit: Payer: Self-pay | Admitting: Neurology

## 2016-01-10 ENCOUNTER — Ambulatory Visit (INDEPENDENT_AMBULATORY_CARE_PROVIDER_SITE_OTHER): Payer: Medicare Other | Admitting: Neurology

## 2016-01-10 ENCOUNTER — Encounter: Payer: Self-pay | Admitting: Neurology

## 2016-01-10 VITALS — BP 139/76 | HR 60 | Ht 74.0 in | Wt 218.5 lb

## 2016-01-10 DIAGNOSIS — R269 Unspecified abnormalities of gait and mobility: Secondary | ICD-10-CM | POA: Diagnosis not present

## 2016-01-10 DIAGNOSIS — G20 Parkinson's disease: Secondary | ICD-10-CM

## 2016-01-10 DIAGNOSIS — G4752 REM sleep behavior disorder: Secondary | ICD-10-CM

## 2016-01-10 MED ORDER — CARBIDOPA-LEVODOPA 25-250 MG PO TABS: 1.0000 | ORAL_TABLET | Freq: Four times a day (QID) | ORAL | 2 refills | Status: DC

## 2016-01-10 NOTE — Progress Notes (Signed)
Reason for visit:  Parkinson's disease  Anthony Lambert is an 81 y.o. male  History of present illness:   Anthony Lambert is a 81 year old right-handed white male with a history of Parkinson's disease. The patient has a gait disorder associated with this, he uses a cane for ambulation. He has had 2 or 3 falls since last seen. The patient has undergone physical therapy with some benefit, he is out of therapy now. He does not exercise much. The patient is on Sinemet taking the 25/250 mg tablet, 1 tablet 4 times daily at 9 AM, 3 PM, 9 PM, and 3 AM. The patient has little variation in his functional level during the day. He does not function well in the morning, he does some better in the afternoon. The patient will freeze when going through doorways. He does have a memory disturbance, he takes Aricept for this. He returns for an evaluation.  Past Medical History:  Diagnosis Date  . Chronic low back pain   . Chronic renal insufficiency   . Depression   . Dyslipidemia   . Frozen shoulder    Left  . HOH (hard of hearing)    bilateral hearing aids  . Hypertension   . Macular degeneration   . Memory disturbance   . Obesity   . Parkinson's disease (HCC)   . REM sleep behavior disorder   . Shingles    Right partietal shingles    Past Surgical History:  Procedure Laterality Date  . COLONOSCOPY W/ POLYPECTOMY    . DUPUYTREN CONTRACTURE RELEASE Bilateral     Family History  Problem Relation Age of Onset  . Cancer - Colon Mother   . Cancer Sister     Breast cancer  . Cancer - Prostate Brother     Social history:  reports that he has never smoked. He has never used smokeless tobacco. He reports that he does not drink alcohol or use drugs.   No Known Allergies  Medications:  Prior to Admission medications   Medication Sig Start Date End Date Taking? Authorizing Provider  carbidopa-levodopa (SINEMET IR) 25-250 MG tablet Take 1 tablet by mouth 4 (four) times daily. 03/03/15  Yes  York Spaniel, MD  clotrimazole-betamethasone (LOTRISONE) cream Apply 1 application topically as needed. 08/04/13  Yes Historical Provider, MD  donepezil (ARICEPT) 10 MG tablet TAKE 1 TABLET EVERY EVENING 11/11/15  Yes York Spaniel, MD  fenofibrate 160 MG tablet Take 160 mg by mouth daily.   Yes Historical Provider, MD  lisinopril (PRINIVIL,ZESTRIL) 20 MG tablet Take 10 mg by mouth daily.    Yes Historical Provider, MD  nystatin (MYCOSTATIN/NYSTOP) 100000 UNIT/GM POWD  05/27/15  Yes Historical Provider, MD  polyethylene glycol (MIRALAX / GLYCOLAX) packet Take 17 g by mouth daily.   Yes Historical Provider, MD  vitamin B-12 (CYANOCOBALAMIN) 1000 MCG tablet Take 1,000 mcg by mouth daily.   Yes Historical Provider, MD    ROS:  Out of a complete 14 system review of symptoms, the patient complains only of the following symptoms, and all other reviewed systems are negative.   Hearing loss  Cough  Cold intolerance, excessive eating  Rectal bleeding, constipation  Insomnia, frequent waking, daytime sleepiness, sleep talking  Incontinence of the bladder, frequency of urination  Back pain, walking difficulty  Dizziness, weakness, tremors  Decreased concentration  Blood pressure 139/76, pulse 60, height 6\' 2"  (1.88 m), weight 218 lb 8 oz (99.1 kg).  Physical Exam  General: The patient is  alert and cooperative at the time of the examination.  Skin: No significant peripheral edema is noted.   Neurologic Exam  Mental status: The patient is alert and oriented x 3 at the time of the examination. The patient has apparent normal recent and remote memory, with an apparently normal attention span and concentration ability. Mini-Mental status examination done today shows a total score of 20/28.   Cranial nerves: Facial symmetry is present. Speech is normal, no aphasia or dysarthria is noted. Extraocular movements are full. On primary gaze there is slight abduction of the left eye. Visual fields  are full. Masking of the face is seen.  Motor: The patient has good strength in all 4 extremities.  Sensory examination: Soft touch sensation is symmetric on the face, arms, and legs.  Coordination: The patient has good finger-nose-finger and heel-to-shin bilaterally. Prominent tremors of both upper extremities are seen, resting tremor.  Gait and station: The patient  Is able to arise from a seated position with arms crossed. Once up, he has a stooped posture, he is able to ambulate independently, symmetric arm swing. Tandem gait is unsteady. Romberg is negative.  Reflexes: Deep tendon reflexes are symmetric.   Assessment/Plan:   1. Parkinson's disease   2. Memory disturbance   3. Gait disturbance   The patient has completed physical therapy with some benefit. He will continue on his current dose of medication, a prescription for Sinemet was given. I have encouraged the patient to enter into some daily physical activity to help slow down progression of the Parkinson's disease. He will follow-up in 5 months, sooner if needed.  [name]. Keith Edi Gorniak MD 01/10/2016 3:11 PM  [facility] [address] [place], [place] [id number]  Phone [phone] Fax [phone]

## 2016-01-10 NOTE — Patient Instructions (Addendum)
Fall Prevention in the Home  Falls can cause injuries and can affect people from all age groups. There are many simple things that you can do to make your home safe and to help prevent falls. WHAT CAN I DO ON THE OUTSIDE OF MY HOME?  Regularly repair the edges of walkways and driveways and fix any cracks.  Remove high doorway thresholds.  Trim any shrubbery on the main path into your home.  Use bright outdoor lighting.  Clear walkways of debris and clutter, including tools and rocks.  Regularly check that handrails are securely fastened and in good repair. Both sides of any steps should have handrails.  Install guardrails along the edges of any raised decks or porches.  Have leaves, snow, and ice cleared regularly.  Use sand or salt on walkways during winter months.  In the garage, clean up any spills right away, including grease or oil spills. WHAT CAN I DO IN THE BATHROOM?  Use night lights.  Install grab bars by the toilet and in the tub and shower. Do not use towel bars as grab bars.  Use non-skid mats or decals on the floor of the tub or shower.  If you need to sit down while you are in the shower, use a plastic, non-slip stool..  Keep the floor dry. Immediately clean up any water that spills on the floor.  Remove soap buildup in the tub or shower on a regular basis.  Attach bath mats securely with double-sided non-slip rug tape.  Remove throw rugs and other tripping hazards from the floor. WHAT CAN I DO IN THE BEDROOM?  Use night lights.  Make sure that a bedside light is easy to reach.  Do not use oversized bedding that drapes onto the floor.  Have a firm chair that has side arms to use for getting dressed.  Remove throw rugs and other tripping hazards from the floor. WHAT CAN I DO IN THE KITCHEN?   Clean up any spills right away.  Avoid walking on wet floors.  Place frequently used items in easy-to-reach places.  If you need to reach for something  above you, use a sturdy step stool that has a grab bar.  Keep electrical cables out of the way.  Do not use floor polish or wax that makes floors slippery. If you have to use wax, make sure that it is non-skid floor wax.  Remove throw rugs and other tripping hazards from the floor. WHAT CAN I DO IN THE STAIRWAYS?  Do not leave any items on the stairs.  Make sure that there are handrails on both sides of the stairs. Fix handrails that are broken or loose. Make sure that handrails are as long as the stairways.  Check any carpeting to make sure that it is firmly attached to the stairs. Fix any carpet that is loose or worn.  Avoid having throw rugs at the top or bottom of stairways, or secure the rugs with carpet tape to prevent them from moving.  Make sure that you have a light switch at the top of the stairs and the bottom of the stairs. If you do not have them, have them installed. WHAT ARE SOME OTHER FALL PREVENTION TIPS?  Wear closed-toe shoes that fit well and support your feet. Wear shoes that have rubber soles or low heels.  When you use a stepladder, make sure that it is completely opened and that the sides are firmly locked. Have someone hold the ladder while you   are using it. Do not climb a closed stepladder.  Add color or contrast paint or tape to grab bars and handrails in your home. Place contrasting color strips on the first and last steps.  Use mobility aids as needed, such as canes, walkers, scooters, and crutches.  Turn on lights if it is dark. Replace any light bulbs that burn out.  Set up furniture so that there are clear paths. Keep the furniture in the same spot.  Fix any uneven floor surfaces.  Choose a carpet design that does not hide the edge of steps of a stairway.  Be aware of any and all pets.  Review your medicines with your healthcare provider. Some medicines can cause dizziness or changes in blood pressure, which increase your risk of falling. Talk  with your health care provider about other ways that you can decrease your risk of falls. This may include working with a physical therapist or trainer to improve your strength, balance, and endurance.   This information is not intended to replace advice given to you by your health care provider. Make sure you discuss any questions you have with your health care provider.   Document Released: 03/10/2002 Document Revised: 08/04/2014 Document Reviewed: 04/24/2014 Elsevier Interactive Patient Education 2016 [employer]  

## 2016-01-11 ENCOUNTER — Ambulatory Visit: Payer: Medicare Other | Admitting: Podiatry

## 2016-02-23 ENCOUNTER — Other Ambulatory Visit: Payer: Self-pay | Admitting: Neurology

## 2016-05-15 ENCOUNTER — Ambulatory Visit: Payer: Medicare Other | Admitting: Adult Health

## 2016-05-17 ENCOUNTER — Ambulatory Visit (INDEPENDENT_AMBULATORY_CARE_PROVIDER_SITE_OTHER): Payer: Medicare Other | Admitting: Adult Health

## 2016-05-17 ENCOUNTER — Encounter: Payer: Self-pay | Admitting: Adult Health

## 2016-05-17 VITALS — BP 118/71 | HR 60 | Ht 74.0 in | Wt 208.0 lb

## 2016-05-17 DIAGNOSIS — G20 Parkinson's disease: Secondary | ICD-10-CM | POA: Diagnosis not present

## 2016-05-17 DIAGNOSIS — R269 Unspecified abnormalities of gait and mobility: Secondary | ICD-10-CM | POA: Diagnosis not present

## 2016-05-17 DIAGNOSIS — R413 Other amnesia: Secondary | ICD-10-CM

## 2016-05-17 NOTE — Patient Instructions (Signed)
Continue Sinemet- space dosing out by 4 hours- 8 am, 12 pm, 4 pm and 8 pm Continue Aricept- memory score stable If your symptoms worsen or you develop new symptoms please let us know.

## 2016-05-17 NOTE — Progress Notes (Signed)
PATIENT: Anthony Lambert DOB: 10-Jul-1934  REASON FOR VISIT: follow up HISTORY FROM: patient  HISTORY OF PRESENT ILLNESS: Anthony Lambert is a 82 year old male with a history of Parkinson's disease and memory disturbance. He returns today for follow-up. He is currently on Sinemet taking his dose at 1:30 AM, 7:30 AM, 1:30 PM and 7:30 PM. He states that he set an alarm and wakes up at 1:30 to take his medication. He feels that his Parkinson symptoms are stable. He does have a tremor primarily in the upper extremities. He states that he tends to walk with a fast-paced gait. He states that he tries to walk slower he will stumble and fall. He is not using a cane or walker today. He does have freezing episodes throughout the day. Denies any problems eating. He feels that his memory is stable. He is able to complete most ADLs with minimal assistance. He states that he primarily needs assistance due to mobility impairment from Parkinson's disease. Denies any vivid dreams. Denies hallucinations or agitation. Overall he feels that he sleeps well at night. He returns today for an evaluation. HISTORY 01/10/16: Anthony Lambert is a 82 year old right-handed white male with a history of Parkinson's disease. The patient has a gait disorder associated with this, he uses a cane for ambulation. He has had 2 or 3 falls since last seen. The patient has undergone physical therapy with some benefit, he is out of therapy now. He does not exercise much. The patient is on Sinemet taking the 25/250 mg tablet, 1 tablet 4 times daily at 9 AM, 3 PM, 9 PM, and 3 AM. The patient has little variation in his functional level during the day. He does not function well in the morning, he does some better in the afternoon. The patient will freeze when going through doorways. He does have a memory disturbance, he takes Aricept for this. He returns for an evaluation.    REVIEW OF SYSTEMS: Out of a complete 14 system review of symptoms, the patient  complains only of the following symptoms, and all other reviewed systems are negative.  Rectal bleeding, constipation, rectal pain, daytime sleepiness, walking difficulty, confusion, decreased concentration, daytime sleepiness, drooling, hearing loss  ALLERGIES: No Known Allergies  HOME MEDICATIONS: Outpatient Medications Prior to Visit  Medication Sig Dispense Refill  . carbidopa-levodopa (SINEMET IR) 25-250 MG tablet Take 1 tablet by mouth 4 (four) times daily. 360 tablet 2  . clotrimazole-betamethasone (LOTRISONE) cream Apply 1 application topically as needed.    . donepezil (ARICEPT) 10 MG tablet TAKE 1 TABLET EVERY EVENING 90 tablet 3  . fenofibrate 160 MG tablet Take 160 mg by mouth daily.    Anthony Lambert lisinopril (PRINIVIL,ZESTRIL) 20 MG tablet Take 10 mg by mouth daily.     Anthony Lambert nystatin (MYCOSTATIN/NYSTOP) 100000 UNIT/GM POWD     . polyethylene glycol (MIRALAX / GLYCOLAX) packet Take 17 g by mouth daily.    . vitamin B-12 (CYANOCOBALAMIN) 1000 MCG tablet Take 1,000 mcg by mouth daily.    . traZODone (DESYREL) 100 MG tablet TAKE 1 TABLET AT BEDTIME (Patient not taking: Reported on 05/17/2016) 90 tablet 1   No facility-administered medications prior to visit.     PAST MEDICAL HISTORY: Past Medical History:  Diagnosis Date  . Chronic low back pain   . Chronic renal insufficiency   . Depression   . Dyslipidemia   . Frozen shoulder    Left  . HOH (hard of hearing)    bilateral hearing aids  .  Hypertension   . Macular degeneration   . Memory disturbance   . Obesity   . Parkinson's disease (HCC)   . REM sleep behavior disorder   . Shingles    Right partietal shingles    PAST SURGICAL HISTORY: Past Surgical History:  Procedure Laterality Date  . COLONOSCOPY W/ POLYPECTOMY    . DUPUYTREN CONTRACTURE RELEASE Bilateral     FAMILY HISTORY: Family History  Problem Relation Age of Onset  . Cancer - Colon Mother   . Cancer Sister     Breast cancer  . Cancer - Prostate Brother       SOCIAL HISTORY: Social History   Social History  . Marital status: Married    Spouse name: N/A  . Number of children: 2  . Years of education: college   Occupational History  . Retired    Social History Main Topics  . Smoking status: Never Smoker  . Smokeless tobacco: Never Used  . Alcohol use No     Comment: Consumes beer on occasion  . Drug use: No  . Sexual activity: Not on file   Other Topics Concern  . Not on file   Social History Narrative   Lives at home   Married   Patient is right handed.   Patient does not drink caffeine.      PHYSICAL EXAM  Vitals:   05/17/16 1446  BP: 118/71  Pulse: 60  Weight: 208 lb (94.3 kg)  Height: 6\' 2"  (1.88 m)   Body mass index is 26.71 kg/m.   MMSE - Mini Mental State Exam 05/17/2016 01/10/2016 03/03/2015  Orientation to time 3 4 4   Orientation to Place 4 5 4   Registration 3 3 3   Attention/ Calculation 3 0 2  Recall 2 2 2   Language- name 2 objects 2 2 2   Language- repeat 1 0 1  Language- follow 3 step command 3 3 3   Language- read & follow direction 1 1 1   Write a sentence 1 0 1  Copy design 1 0 1  Total score 24 20 24      Generalized: Well developed, in no acute distress   Neurological examination  Mentation: Alert. Follows all commands speech and language fluent Cranial nerve II-XII: Pupils were equal round reactive to light. Extraocular movements were full, visual field were full on confrontational test. Facial sensation and strength were normal. Uvula tongue midline. Head turning and shoulder shrug  were normal and symmetric. Motor: The motor testing reveals 5 over 5 strength of all 4 extremities. Good symmetric motor tone is noted throughout. Resting tremor noted in right upper extremity Sensory: Sensory testing is intact to soft touch on all 4 extremities. No evidence of extinction is noted.  Coordination: Cerebellar testing reveals good finger-nose-finger and heel-to-shin bilaterally.  Gait and  station: Patient is able to stand without assistance. He has a slightly stooped posture. Patient has good stride but gait is fast pace. 3 step turn. Good arm swing. Reflexes: Deep tendon reflexes are symmetric and normal bilaterally.   DIAGNOSTIC DATA (LABS, IMAGING, TESTING) - I reviewed patient records, labs, notes, testing and imaging myself where available.      Component Value Date/Time   NA 136 06/03/2008 0838   K 4.4 06/03/2008 0838   CL 105 06/03/2008 0838   CO2 25 06/03/2008 0838   GLUCOSE 163 (H) 06/03/2008 0838   BUN 18 06/03/2008 0838   CREATININE 1.0 06/03/2008 0838   CALCIUM 9.0 06/03/2008 0838   GFRNONAA 78  06/03/2008 0838   GFRAA 94 06/03/2008 0838     ASSESSMENT AND PLAN 82 y.o. year old male  has a past medical history of Chronic low back pain; Chronic renal insufficiency; Depression; Dyslipidemia; Frozen shoulder; HOH (hard of hearing); Hypertension; Macular degeneration; Memory disturbance; Obesity; Parkinson's disease (HCC); REM sleep behavior disorder; and Shingles. here with:  1. Parkinson's disease 2. Abnormality of gait 3. Memory disturbance  The patient's memory score has remained stable. He will remain on Aricept. I have instructed the patient that he should take his Sinemet every 4 hours. There is no need for him to wake up in the middle the night to take his medication. He will try taking his medication at 8 AM, 12 PM, 4 PM and 8 PM. If this is not beneficial for the patient he will let me know. He will follow-up in 6 months or sooner if needed.   Butch PennyMegan Nandita Mathenia, MSN, NP-C 05/17/2016, 3:03 PM [facility] [address], [address] [place], [place] [id number] [phone]

## 2016-05-17 NOTE — Progress Notes (Signed)
I have read the note, and I agree with the clinical assessment and plan.  Edker Punt KEITH   

## 2016-10-06 ENCOUNTER — Other Ambulatory Visit: Payer: Self-pay | Admitting: Neurology

## 2016-11-14 ENCOUNTER — Ambulatory Visit (INDEPENDENT_AMBULATORY_CARE_PROVIDER_SITE_OTHER): Payer: Medicare Other | Admitting: Adult Health

## 2016-11-14 ENCOUNTER — Encounter: Payer: Self-pay | Admitting: Adult Health

## 2016-11-14 VITALS — BP 109/67 | HR 61 | Ht 74.0 in | Wt 207.6 lb

## 2016-11-14 DIAGNOSIS — G20 Parkinson's disease: Secondary | ICD-10-CM

## 2016-11-14 DIAGNOSIS — R269 Unspecified abnormalities of gait and mobility: Secondary | ICD-10-CM

## 2016-11-14 DIAGNOSIS — R413 Other amnesia: Secondary | ICD-10-CM | POA: Diagnosis not present

## 2016-11-14 NOTE — Patient Instructions (Signed)
Continue Sinemet for parkinson's  Continue Aricept  Memory score is stable If your symptoms worsen or you develop new symptoms please let us know.

## 2016-11-14 NOTE — Progress Notes (Signed)
PATIENT: Anthony Lambert DOB: October 28, 1934  REASON FOR VISIT: follow up- parkinson's disease HISTORY FROM: patient  HISTORY OF PRESENT ILLNESS: Today 11/14/16 Anthony Lambert is an 82 year old male with a history of Parkinson's disease and memory disturbance. He returns today for follow-up. He is currently taking Sinemet at 8 AM, 12 PM, 4 PM and 8 PM. His wife states that he continues to have tremors. His gait is unsteady. He often marches when ambulating. He also has freezing episodes. She reports that he had a fall approximately 1 week ago. He has participated in physical therapy. They deny any significant changes with his memory. He remains on Aricept. The patient's wife states that she went to a symposium with Dr. Arbutus Leas. She reports that they are interested in deep brain stimulator. She would like a referral to discuss this. Patient returns today for an evaluation.  HISTORY 05/17/16: Anthony Lambert is a 82 year old male with a history of Parkinson's disease and memory disturbance. He returns today for follow-up. He is currently on Sinemet taking his dose at 1:30 AM, 7:30 AM, 1:30 PM and 7:30 PM. He states that he set an alarm and wakes up at 1:30 to take his medication. He feels that his Parkinson symptoms are stable. He does have a tremor primarily in the upper extremities. He states that he tends to walk with a fast-paced gait. He states that he tries to walk slower he will stumble and fall. He is not using a cane or walker today. He does have freezing episodes throughout the day. Denies any problems eating. He feels that his memory is stable. He is able to complete most ADLs with minimal assistance. He states that he primarily needs assistance due to mobility impairment from Parkinson's disease. Denies any vivid dreams. Denies hallucinations or agitation. Overall he feels that he sleeps well at night. He returns today for an evaluation.  REVIEW OF SYSTEMS: Out of a complete 14 system review of symptoms, the  patient complains only of the following symptoms, and all other reviewed systems are negative.  See history of present illness  ALLERGIES: No Known Allergies  HOME MEDICATIONS: Outpatient Medications Prior to Visit  Medication Sig Dispense Refill  . carbidopa-levodopa (SINEMET IR) 25-250 MG tablet TAKE 1 TABLET FOUR TIMES A DAY 360 tablet 2  . clotrimazole-betamethasone (LOTRISONE) cream Apply 1 application topically as needed.    . donepezil (ARICEPT) 10 MG tablet TAKE 1 TABLET EVERY EVENING 90 tablet 3  . fenofibrate 160 MG tablet Take 160 mg by mouth daily.    . polyethylene glycol (MIRALAX / GLYCOLAX) packet Take 17 g by mouth daily as needed.     . vitamin B-12 (CYANOCOBALAMIN) 1000 MCG tablet Take 1,000 mcg by mouth daily.     No facility-administered medications prior to visit.     PAST MEDICAL HISTORY: Past Medical History:  Diagnosis Date  . Chronic low back pain   . Chronic renal insufficiency   . Depression   . Dyslipidemia   . Frozen shoulder    Left  . HOH (hard of hearing)    bilateral hearing aids  . Hypertension   . Macular degeneration   . Memory disturbance   . Obesity   . Parkinson's disease (HCC)   . REM sleep behavior disorder   . Shingles    Right partietal shingles    PAST SURGICAL HISTORY: Past Surgical History:  Procedure Laterality Date  . COLONOSCOPY W/ POLYPECTOMY    . DUPUYTREN CONTRACTURE RELEASE Bilateral  FAMILY HISTORY: Family History  Problem Relation Age of Onset  . Cancer - Colon Mother   . Cancer Sister        Breast cancer  . Cancer - Prostate Brother     SOCIAL HISTORY: Social History   Social History  . Marital status: Married    Spouse name: N/A  . Number of children: 2  . Years of education: college   Occupational History  . Retired    Social History Main Topics  . Smoking status: Never Smoker  . Smokeless tobacco: Never Used  . Alcohol use No     Comment: Consumes beer on occasion  . Drug use: No    . Sexual activity: Not on file   Other Topics Concern  . Not on file   Social History Narrative   Lives at home   Married   Patient is right handed.   Patient does not drink caffeine.      PHYSICAL EXAM  Vitals:   11/14/16 1403  BP: 109/67  Pulse: 61  Weight: 207 lb 9.6 oz (94.2 kg)  Height: 6\' 2"  (1.88 m)   Body mass index is 26.65 kg/m.   MMSE - Mini Mental State Exam 11/14/2016 05/17/2016 01/10/2016  Orientation to time 4 3 4   Orientation to Place 5 4 5   Registration 2 3 3   Attention/ Calculation 0 3 0  Recall 3 2 2   Language- name 2 objects 2 2 2   Language- repeat 0 1 0  Language- follow 3 step command 3 3 3   Language- read & follow direction 1 1 1   Write a sentence 1 1 0  Copy design 1 1 0  Total score 22 24 20      Generalized: Well developed, in no acute distress   Neurological examination  Mentation: Alert,. Follows all commands. hypophonia Cranial nerve II-XII: Pupils were equal round reactive to light. Extraocular movements were full, visual field were full on confrontational test. Facial sensation and strength were normal. Uvula tongue midline. Head turning and shoulder shrug  were normal and symmetric. Motor: The motor testing reveals 5 over 5 strength of all 4 extremities. Good symmetric motor tone is noted throughout.  Sensory: Sensory testing is intact to soft touch on all 4 extremities. No evidence of extinction is noted.  Coordination: Cerebellar testing reveals good finger-nose-finger and heel-to-shin bilaterally.  Gait and station: patient's gait is unsteady. He has a marching-type gait.Freezing episodes noted. A gait belt were used on the patient. He had a two-person assist and at times became very unsteady on his feet. Reflexes: Deep tendon reflexes are symmetric and normal bilaterally.   DIAGNOSTIC DATA (LABS, IMAGING, TESTING) - I reviewed patient records, labs, notes, testing and imaging myself where available.     Component Value  Date/Time   NA 136 06/03/2008 0838   K 4.4 06/03/2008 0838   CL 105 06/03/2008 0838   CO2 25 06/03/2008 0838   GLUCOSE 163 (H) 06/03/2008 0838   BUN 18 06/03/2008 0838   CREATININE 1.0 06/03/2008 0838   CALCIUM 9.0 06/03/2008 0838   GFRNONAA 78 06/03/2008 0838   GFRAA 94 06/03/2008 0838      ASSESSMENT AND PLAN 82 y.o. year old male  has a past medical history of Chronic low back pain; Chronic renal insufficiency; Depression; Dyslipidemia; Frozen shoulder; HOH (hard of hearing); Hypertension; Macular degeneration; Memory disturbance; Obesity; Parkinson's disease (HCC); REM sleep behavior disorder; and Shingles. here with:  1. Parkinson's disease 2. Memory disturbance 2.  Abnormality of gait  The patient's memory has remained stable. He does have difficulty with gait. I discussed potential medication adjustments with the patient and his wife however at this time she does not want me to make any adjustments. She would rather have a referral to discuss deep brain stimulator as well as obtain a second opinion. I will make a referral from Dr.Tat. Patient will continue on Aricept for memory disturbance. Patient's wife does not want to schedule a follow-up appointment at this time.    Butch Penny, MSN, NP-C 11/14/2016, 2:39 PM [facility] [address], [address] [place], [place] [address] [phone]

## 2016-11-14 NOTE — Progress Notes (Signed)
I have read the note, and I agree with the clinical assessment and plan.  Charolette Bultman KEITH   

## 2016-11-17 ENCOUNTER — Encounter: Payer: Self-pay | Admitting: Neurology

## 2016-12-07 NOTE — Progress Notes (Signed)
Anthony Lambert was seen today in the movement disorders clinic for neurologic consultation at the request of Butch Penny, NP.  His PCP is Iona Hansen, NP.  This patient is accompanied in the office by his spouse who supplements the history.  The consultation is for the evaluation of PD and to eval for DBS.  The records that were made available to me were reviewed. I only have records back as far as 2014, but the patient was diagnosed long before then.  He reports diagnosis since approximately 2007.  His first symptom was tremor of the R hand.  He is currently on carbidopa/levodopa 25/250 at 8 AM/noon/4 PM/8 PM.  He takes a total daily levodopa load of 1000 mg per day.  Specific Symptoms:  Tremor: Yes.   (is "all over" when tired and when grabs something like glass) Family hx of similar:  No. Voice: gotten quiet Sleep: some intermittent trouble  Vivid Dreams:  No.  Acting out dreams:  No. Wet Pillows: No. Postural symptoms:  Yes.    Falls?  Yes.  , one time every 1.5-2 weeks (often without a walker) Bradykinesia symptoms: slow movements, drooling while awake and difficulty getting out of a chair Loss of smell:  Yes.   Loss of taste:  Yes.   Urinary Incontinence:  Yes.   Difficulty Swallowing:  No. Handwriting, micrographia: No. Trouble with ADL's:  Yes.   (wife helps dress him)  Trouble buttoning clothing: Yes.   Depression:  Has trouble answering this question - patient states "my mood is a 'c'" Memory changes:  Yes.   (the patient has been on Aricept at least since 2014, which is as far back as EMR goes for gna but MMSE fairly stable.  Patient states memory not good and wife states that neither of them with great memory.  Pt quit driving x 4-5 years ago because of feet not "listening."  Pt sets alarm for pill reminders.  Wife does finances and always has.  Wife does cooking) Hallucinations:  Yes.  , arises at night when in bed but he is awake.  Occurs one time every other  month  visual distortions: Yes.   N/V:  No. Lightheaded:  No.  Syncope: Yes.   (one of falls was a pass out episode.  Wife states that she was unaware of that.  Pt states that it occurred 3 weeks ago) Diplopia:  Yes.   Dyskinesia:  Yes.  per wife, no per patient  PREVIOUS MEDICATIONS:  Requip, levodopa, trazodone  ALLERGIES:  No Known Allergies  CURRENT MEDICATIONS:  Outpatient Encounter Prescriptions as of 12/11/2016  Medication Sig  . carbidopa-levodopa (SINEMET IR) 25-250 MG tablet TAKE 1 TABLET FOUR TIMES A DAY  . clotrimazole-betamethasone (LOTRISONE) cream Apply 1 application topically as needed.  . donepezil (ARICEPT) 10 MG tablet TAKE 1 TABLET EVERY EVENING  . fenofibrate 160 MG tablet Take 160 mg by mouth daily.  . polyethylene glycol (MIRALAX / GLYCOLAX) packet Take 17 g by mouth daily as needed.   . vitamin B-12 (CYANOCOBALAMIN) 500 MCG tablet Take 500 mcg by mouth daily.   No facility-administered encounter medications on file as of 12/11/2016.     PAST MEDICAL HISTORY:   Past Medical History:  Diagnosis Date  . Chronic low back pain   . Chronic renal insufficiency   . Depression   . Dyslipidemia   . Frozen shoulder    Left  . HOH (hard of hearing)    bilateral hearing  aids  . Hypertension   . Macular degeneration   . Memory disturbance   . Obesity   . Parkinson's disease (HCC)   . REM sleep behavior disorder   . Shingles    Right partietal shingles  . Strabismus     PAST SURGICAL HISTORY:   Past Surgical History:  Procedure Laterality Date  . COLONOSCOPY W/ POLYPECTOMY    . DUPUYTREN CONTRACTURE RELEASE Bilateral   . EYE SURGERY Left    strabismus sx as child    SOCIAL HISTORY:   Social History   Social History  . Marital status: Married    Spouse name: N/A  . Number of children: 2  . Years of education: college   Occupational History  . Retired     [employer]   Social History Main Topics  . Smoking status: Never Smoker  . Smokeless  tobacco: Never Used  . Alcohol use No  . Drug use: No  . Sexual activity: Not on file   Other Topics Concern  . Not on file   Social History Narrative   Lives at home   Married   Patient is right handed.   Patient does not drink caffeine.    FAMILY HISTORY:   Family Status  Relation Status  . Mother Deceased at age 80  . Father Deceased at age 80       Accidental death  . Sister Alive  . Brother Alive  . Sister Alive  . Son Alive    ROS:  A complete 10 system review of systems was obtained and was unremarkable apart from what is mentioned above.  PHYSICAL EXAMINATION:    VITALS:   Vitals:   12/11/16 1309  BP: 102/64  Pulse: (!) 59  SpO2: 95%  Weight: 206 lb (93.4 kg)  Height: 6\' 2"  (1.88 m)    GEN:  The patient appears stated age and is in NAD. HEENT:  Normocephalic, atraumatic.  The mucous membranes are moist. The superficial temporal arteries are without ropiness or tenderness. CV:  Bradycardic regular. Lungs:  CTAB Neck/HEME:  There are no carotid bruits bilaterally.  Neurological examination:  Orientation: He is able to correctly name the month, date and year.  He is very slow when it comes to trail making, but is able to do this task.  He relies on his wife to answer the finer details of the history.  He has difficulty with remote memory.  MoCA is attempted today, but the patient took so long to do the task (more than 15 minutes) that it was aborted. Cranial nerves: There is good facial symmetry. Pupils are equal round and reactive to light bilaterally. Fundoscopic exam reveals clear margins bilaterally. Extraocular muscles are intact but he has some strabismus. The visual fields are full to confrontational testing. The speech is fluent and clear.  He is hypophonic.  Soft palate rises symmetrically and there is no tongue deviation. Hearing is intact to conversational tone. Sensation: Sensation is intact to light and pinprick throughout (facial, trunk,  extremities). Vibration is intact at the bilateral big toe but it is decreased. There is no extinction with double simultaneous stimulation. There is no sensory dermatomal level identified. Motor: Strength is 5/5 in the bilateral upper and lower extremities.   Shoulder shrug is equal and symmetric.  There is no pronator drift. Deep tendon reflexes: Deep tendon reflexes are 2/4 at the bilateral biceps, triceps, brachioradialis, patella and achilles. Plantar responses are downgoing bilaterally.  Movement examination: Tone:  There is normal tone in the bilateral upper extremities.  The tone in the lower extremities is normal (there is gegenhalten).  Abnormal movements: There is dyskinesia in the head and axially Coordination:  There is decremation with RAM's, but only with hand opening and closing on the L.  He has some trouble with finger taps on the L but it is more apraxia than decremation.   Gait and Station: The patient has difficulty arising out of a deep-seated chair without the use of the hands and once he is able to get up, he falls back into the chair.  A gait    belt is placed on to the patient.  He is given a walker.  At times, he walks very quickly with good stride length and at other times he will have a short based gait and will shuffle with start hesitation.  ASSESSMENT/PLAN:  1.  Parkinsons disease, dx in 2007  -Pts disease is complicated by memory change, motor fluctuations with dyskinesia, gait instability.  The patient is referred to see if he is a DBS candidate.  I suspect that the patient is not a candidate because of memory change and suspect that there is a component of Parkinson's disease dementia.  He is quite apraxic at times and also has intermittent hallucinations.  I did tell him that the next step in evaluation would be neurocognitive testing.  He was very leery of this and said he would think about it, but it did not sound like he wanted to proceed with that.  I told him that  we could not entertain any type of surgery without doing this testing.    -I did tell the patient that he could be a duopa candidate.  We talked about what this was and the benefits of this as well as the risks.  He can certainly talk about this more in detail with Dr. Anne Hahn and Butch Penny, NP.    -We talked about things and trial that could be of benefit.  We talked about the pump/patch system that looks very beneficial, but is not yet on the market.  -We talked about the importance of safety in the home.  I think he needs to use his walker at all times, which he is certainly not doing.  He does have [facility] 2 days per week for 3 hours each day  -I did talk to him about the importance of exercise.  We talked about going to [facility] and he was there years ago.  I think this would benefit him.  2.  He will follow-up with Dr. Anne Hahn and Butch Penny, NP at his previously scheduled appointment.  If he decides to proceed with the above testing, he can just call our office and schedule that.  Much greater than 50% of this visit was spent in counseling and coordinating care.  Total face to face time:  60 min   Cc:  Iona Hansen, NP

## 2016-12-11 ENCOUNTER — Ambulatory Visit (INDEPENDENT_AMBULATORY_CARE_PROVIDER_SITE_OTHER): Payer: Medicare Other | Admitting: Neurology

## 2016-12-11 ENCOUNTER — Encounter: Payer: Self-pay | Admitting: Neurology

## 2016-12-11 VITALS — BP 102/64 | HR 59 | Ht 74.0 in | Wt 206.0 lb

## 2016-12-11 DIAGNOSIS — G20 Parkinson's disease: Secondary | ICD-10-CM

## 2016-12-11 DIAGNOSIS — R413 Other amnesia: Secondary | ICD-10-CM

## 2016-12-11 DIAGNOSIS — G249 Dystonia, unspecified: Secondary | ICD-10-CM | POA: Diagnosis not present

## 2016-12-11 DIAGNOSIS — R441 Visual hallucinations: Secondary | ICD-10-CM

## 2016-12-11 NOTE — Patient Instructions (Signed)
1.  Please let us know if you would like to schedule neurocognitive testing.  That would help to determine if you are a candidate for surgery for parkinsons disease  2.  Look into the ACT program  3.  Feel free to call back and talk with our social worker Shanda BumpsJessica about community resources and about the local parkinsons support group that [facility] the 3rd Tuesday of [facility]

## 2017-03-01 ENCOUNTER — Emergency Department (HOSPITAL_BASED_OUTPATIENT_CLINIC_OR_DEPARTMENT_OTHER): Payer: Medicare Other

## 2017-03-01 ENCOUNTER — Other Ambulatory Visit: Payer: Self-pay

## 2017-03-01 ENCOUNTER — Encounter (HOSPITAL_BASED_OUTPATIENT_CLINIC_OR_DEPARTMENT_OTHER): Payer: Self-pay | Admitting: *Deleted

## 2017-03-01 ENCOUNTER — Emergency Department (HOSPITAL_BASED_OUTPATIENT_CLINIC_OR_DEPARTMENT_OTHER)
Admission: EM | Admit: 2017-03-01 | Discharge: 2017-03-01 | Disposition: A | Discharge status: Home / Self Care | Payer: Medicare Other | Attending: Emergency Medicine | Admitting: Emergency Medicine

## 2017-03-01 DIAGNOSIS — R0789 Other chest pain: Secondary | ICD-10-CM | POA: Diagnosis not present

## 2017-03-01 DIAGNOSIS — Z79899 Other long term (current) drug therapy: Secondary | ICD-10-CM | POA: Diagnosis not present

## 2017-03-01 DIAGNOSIS — G20 Parkinson's disease: Secondary | ICD-10-CM | POA: Insufficient documentation

## 2017-03-01 DIAGNOSIS — W010XXA Fall on same level from slipping, tripping and stumbling without subsequent striking against object, initial encounter: Secondary | ICD-10-CM | POA: Insufficient documentation

## 2017-03-01 DIAGNOSIS — I10 Essential (primary) hypertension: Secondary | ICD-10-CM | POA: Diagnosis not present

## 2017-03-01 DIAGNOSIS — Y92481 Parking lot as the place of occurrence of the external cause: Secondary | ICD-10-CM | POA: Insufficient documentation

## 2017-03-01 DIAGNOSIS — S0990XA Unspecified injury of head, initial encounter: Secondary | ICD-10-CM | POA: Diagnosis not present

## 2017-03-01 DIAGNOSIS — Z23 Encounter for immunization: Secondary | ICD-10-CM | POA: Insufficient documentation

## 2017-03-01 DIAGNOSIS — Y9389 Activity, other specified: Secondary | ICD-10-CM | POA: Diagnosis not present

## 2017-03-01 DIAGNOSIS — Y998 Other external cause status: Secondary | ICD-10-CM | POA: Insufficient documentation

## 2017-03-01 DIAGNOSIS — R296 Repeated falls: Secondary | ICD-10-CM | POA: Diagnosis not present

## 2017-03-01 DIAGNOSIS — R51 Headache: Secondary | ICD-10-CM | POA: Insufficient documentation

## 2017-03-01 MED ORDER — TETANUS-DIPHTH-ACELL PERTUSSIS 5-2.5-18.5 LF-MCG/0.5 IM SUSP
0.5000 mL | Freq: Once | INTRAMUSCULAR | Status: AC
  Administered 2017-03-01: 0.5 mL via INTRAMUSCULAR
  Filled 2017-03-01: qty 0.5

## 2017-03-01 MED ORDER — BACITRACIN ZINC 500 UNIT/GM EX OINT
TOPICAL_OINTMENT | Freq: Two times a day (BID) | CUTANEOUS | Status: DC
  Administered 2017-03-01: 1 via TOPICAL
  Filled 2017-03-01: qty 28.35

## 2017-03-01 NOTE — ED Provider Notes (Signed)
[facility] EMERGENCY DEPARTMENT Provider Note   CSN: [id number] Arrival date & time: 03/01/17  [id number]     History   Chief Complaint Chief Complaint  Patient presents with  . Fall  . Head Injury    HPI Anthony Lambert is a 82 y.o. male.  HPI   82 year old male with past medical history of severe Parkinson's here with mechanical fall.  The patient has history of recurrent falls due to his Parkinson's.  He reports he was walking in the parking lot, when he tripped.  He reportedly tried to open a car door while moving too fast, and hit his chest and then fell backwards on his head.  There is direct head collision.  He did not lose consciousness and has been able to ambulate since then.  He reports a mild, aching, frontal headache.  Denies any neck pain or stiffness.  No arm numbness or weakness.  He does note some mild right-sided chest wall pain where the car door hit him, but denies any shortness of breath or cough.  No sputum production.  No abdominal pain.  Past Medical History:  Diagnosis Date  . Chronic low back pain   . Chronic renal insufficiency   . Depression   . Dyslipidemia   . Frozen shoulder    Left  . HOH (hard of hearing)    bilateral hearing aids  . Hypertension   . Macular degeneration   . Memory disturbance   . Obesity   . Parkinson's disease (HCC)   . REM sleep behavior disorder   . Shingles    Right partietal shingles  . Strabismus     Patient Active Problem List   Diagnosis Date Noted  . Chronic low back pain 10/26/2014  . Pain in lower limb 03/31/2014  . Onychomycosis 10/29/2013  . Painful legs and moving toes 10/29/2013  . Dysphagia, unspecified(787.20) 04/19/2012  . Paralysis agitans (HCC) 04/19/2012  . REM sleep behavior disorder 04/19/2012  . Abnormality of gait 04/19/2012  . Sprain of lumbosacral (joint) (ligament) 04/19/2012  . Memory loss 04/19/2012    Past Surgical History:  Procedure Laterality Date  . COLONOSCOPY W/  POLYPECTOMY    . DUPUYTREN CONTRACTURE RELEASE Bilateral   . EYE SURGERY Left    strabismus sx as child       Home Medications    Prior to Admission medications   Medication Sig Start Date End Date Taking? Authorizing Provider  carbidopa-levodopa (SINEMET IR) 25-250 MG tablet TAKE 1 TABLET FOUR TIMES A DAY 10/06/16   Butch PennyMillikan, Megan, NP  clotrimazole-betamethasone (LOTRISONE) cream Apply 1 application topically as needed. 08/04/13   [provider]  donepezil (ARICEPT) 10 MG tablet TAKE 1 TABLET EVERY EVENING 11/11/15   York SpanielWillis, Charles K, MD  fenofibrate 160 MG tablet Take 160 mg by mouth daily.    [provider]  polyethylene glycol (MIRALAX / GLYCOLAX) packet Take 17 g by mouth daily as needed.     [provider]  vitamin B-12 (CYANOCOBALAMIN) 500 MCG tablet Take 500 mcg by mouth daily.    [provider]    Family History Family History  Problem Relation Age of Onset  . Cancer - Colon Mother   . Cancer Sister        Breast cancer  . Cancer - Prostate Brother     Social History Social History   Tobacco Use  . Smoking status: Never Smoker  . Smokeless tobacco: Never Used  Substance Use Topics  .  Alcohol use: No    Alcohol/week: 0.0 oz  . Drug use: No     Allergies   Patient has no known allergies.   Review of Systems Review of Systems  Constitutional: Negative for chills, fatigue and fever.  HENT: Negative for congestion and rhinorrhea.   Eyes: Negative for visual disturbance.  Respiratory: Negative for cough, shortness of breath and wheezing.   Cardiovascular: Positive for chest pain (Right-sided chest wall pain). Negative for leg swelling.  Gastrointestinal: Negative for abdominal pain, diarrhea, nausea and vomiting.  Genitourinary: Negative for dysuria and flank pain.  Musculoskeletal: Negative for neck pain and neck stiffness.  Skin: Negative for rash and wound.  Allergic/Immunologic: Negative for immunocompromised state.   Neurological: Positive for headaches. Negative for syncope and weakness.  All other systems reviewed and are negative.    Physical Exam Updated Vital Signs BP 138/66   Pulse 62   Temp 97.8 F (36.6 C) (Oral)   Resp 16   Ht 6\' 2"  (1.88 m)   Wt 90.3 kg (199 lb)   SpO2 93%   BMI 25.55 kg/m   Physical Exam  Constitutional: He is oriented to person, place, and time. He appears well-developed and well-nourished. No distress.  HENT:  Head: Normocephalic and atraumatic.  Small, approximately 2 x 2 centimeter hematoma to the right brow.  There is superficial abrasion overlying the hematoma with no lacerations.  No deformity or step-offs.  Eyes: Conjunctivae are normal.  Neck: Neck supple.  No midline tenderness.  Painless, full range of motion.  Cardiovascular: Normal rate, regular rhythm and normal heart sounds. Exam reveals no friction rub.  No murmur heard. Pulmonary/Chest: Effort normal and breath sounds normal. No respiratory distress. He has no wheezes. He has no rales.  Mild chest wall tenderness over the right lateral thoracic wall.  No bruising or deformity.  Abdominal: Soft. Bowel sounds are normal. He exhibits no distension.  Musculoskeletal: He exhibits no edema.  Neurological: He is alert and oriented to person, place, and time. He exhibits normal muscle tone.  Resting tremor, left greater than right.  No overt cogwheeling or rigidity.  Skin: Skin is warm. Capillary refill takes less than 2 seconds.  Psychiatric: He has a normal mood and affect.  Nursing note and vitals reviewed.    ED Treatments / Results  Labs (all labs ordered are listed, but only abnormal results are displayed) Labs Reviewed - No data to display  EKG  EKG Interpretation None       Radiology Dg Chest 2 View  Result Date: 03/01/2017 CLINICAL DATA:  Tripped and fell in parking lot. EXAM: CHEST  2 VIEW COMPARISON:  03/14/2010 FINDINGS: The cardiopericardial silhouette is within normal  limits for size. The lungs are clear without focal pneumonia, edema, pneumothorax or pleural effusion. The visualized bony structures of the thorax are intact. IMPRESSION: No active cardiopulmonary disease. Electronically Signed   By: Kennith CenterEric  Mansell M.D.   On: 03/01/2017 21:01   Ct Head Wo Contrast  Result Date: 03/01/2017 CLINICAL DATA:  Tripped and fell on the parking lot. Hematoma and abrasion to right forehead. EXAM: CT HEAD WITHOUT CONTRAST TECHNIQUE: Contiguous axial images were obtained from the base of the skull through the vertex without intravenous contrast. COMPARISON:  No comparison studies available. FINDINGS: Brain: There is no evidence for acute hemorrhage, hydrocephalus, mass lesion, or abnormal extra-axial fluid collection. No definite CT evidence for acute infarction. Diffuse loss of parenchymal volume is consistent with atrophy. Patchy low attenuation in the  deep hemispheric and periventricular white matter is nonspecific, but likely reflects chronic microvascular ischemic demyelination. Vascular: No hyperdense vessel or unexpected calcification. Skull: No evidence for fracture. No worrisome lytic or sclerotic lesion. Sinuses/Orbits: The visualized paranasal sinuses and mastoid air cells are clear. Visualized portions of the globes and intraorbital fat are unremarkable. Other: Right frontal scalp contusion evident. IMPRESSION: 1. No acute intracranial abnormality. 2. Atrophy with chronic small vessel white matter ischemic disease. Electronically Signed   By: Kennith Center M.D.   On: 03/01/2017 20:52    Procedures Procedures (including critical care time)  Medications Ordered in ED Medications  bacitracin ointment (1 application Topical Given 03/01/17 2108)  Tdap (BOOSTRIX) injection 0.5 mL (0.5 mLs Intramuscular Given 03/01/17 2112)     Initial Impression / Assessment and Plan / ED Course  I have reviewed the triage vital signs and the nursing notes.  Pertinent labs & imaging  results that were available during my care of the patient were reviewed by me and considered in my medical decision making (see chart for details).     82 year old male with history of recurrent falls due to Parkinson's here with mechanical fall and small head contusion.  No lacerations.  Tetanus updated and wound dressed with bacitracin.  Chest x-ray and CT had obtained and are negative.  He has no cervical spine tenderness.  He is otherwise at his baseline state of health and requesting discharge.  Will discharge with good return precautions.  Final Clinical Impressions(s) / ED Diagnoses   Final diagnoses:  Injury of head, initial encounter    ED Discharge Orders    None       Shaune Pollack, MD 03/01/17 2324

## 2017-03-01 NOTE — ED Triage Notes (Signed)
Tripped over his cane and fell in the parking lot. Hematoma with abrasion to his right forehead. No LOC. He does not take blood thinners. Alert and oriented.

## 2017-03-28 ENCOUNTER — Other Ambulatory Visit: Payer: Self-pay | Admitting: Neurology

## 2017-07-03 ENCOUNTER — Other Ambulatory Visit: Payer: Self-pay | Admitting: Adult Health

## 2017-07-17 ENCOUNTER — Other Ambulatory Visit: Payer: Self-pay | Admitting: Neurology

## 2017-07-17 ENCOUNTER — Telehealth: Payer: Self-pay | Admitting: Adult Health

## 2017-07-17 MED ORDER — CARBIDOPA-LEVODOPA 25-250 MG PO TABS: 1.0000 | ORAL_TABLET | Freq: Four times a day (QID) | ORAL | 1 refills | Status: DC

## 2017-07-17 NOTE — Telephone Encounter (Signed)
Called and informed the pt's wife that the [facility]ill request has [facility] as [facility] her request. She was appreciative and verbalized understanding

## 2017-07-17 NOTE — Telephone Encounter (Signed)
Pt's wife called and scheduled an appt for 9/3. She said the pt has 16 days left of carbidopa-levodopa (SINEMET IR) 25-250 MG tablet, [facility]. Please call and let her know when it has been sent. Thank you

## 2017-12-04 ENCOUNTER — Encounter: Payer: Self-pay | Admitting: Adult Health

## 2017-12-04 ENCOUNTER — Ambulatory Visit (INDEPENDENT_AMBULATORY_CARE_PROVIDER_SITE_OTHER): Payer: Medicare Other | Admitting: Adult Health

## 2017-12-04 VITALS — BP 144/86 | HR 64 | Ht 74.0 in | Wt 195.0 lb

## 2017-12-04 DIAGNOSIS — R413 Other amnesia: Secondary | ICD-10-CM

## 2017-12-04 DIAGNOSIS — G20 Parkinson's disease: Secondary | ICD-10-CM | POA: Diagnosis not present

## 2017-12-04 NOTE — Patient Instructions (Signed)
Your Plan:  Continue Aricept and Sinemet If your symptoms worsen or you develop new symptoms please let us know.   Thank you for coming to see us at [facility]. I hope we have been able to provide you high quality care today.  You may receive a patient satisfaction survey over the next few weeks. We would appreciate your feedback and comments so that we may continue to improve ourselves and the health of our patients.  

## 2017-12-04 NOTE — Progress Notes (Signed)
PATIENT: Anthony Lambert DOB: 25-Mar-1935  REASON FOR VISIT: follow up HISTORY FROM: patient  HISTORY OF PRESENT ILLNESS: Today 12/04/17: [name] is an 83 year old male with a history of Parkinson's disease.  He returns today for follow-up.  He did have a consultation with Dr. Arbutus Leas. He recommended neuropsychological testing before they could proceed with consideration of a deep brain stimulator.  The patient deferred.  He continues on Sinemet 1 tablet 4 times a day.  He continues on Aricept at bedtime.  The patient's wife is with him today.  She reports that over the last year he has had 2 or 3 falls.  He continues to have some difficulty swallowing food and liquids.  He has had a swallow test in the past per his wife.  He uses a walker when ambulating.  He does report drooling.  He does require assistance with ADLs.  They have a CNA that comes in weekly.  The patient does not operate a motor vehicle.  He returns today for evaluation.  HISTORY 11/14/16 [name] is an 83 year old male with a history of Parkinson's disease and memory disturbance. He returns today for follow-up. He is currently taking Sinemet at 8 AM, 12 PM, 4 PM and 8 PM. His wife states that he continues to have tremors. His gait is unsteady. He often marches when ambulating. He also has freezing episodes. She reports that he had a fall approximately 1 week ago. He has participated in physical therapy. They deny any significant changes with his memory. He remains on Aricept. The patient's wife states that she went to a symposium with Dr. Arbutus Leas. She reports that they are interested in deep brain stimulator. She would like a referral to discuss this. Patient returns today for an evaluation.   REVIEW OF SYSTEMS: Out of a complete 14 system review of symptoms, the patient complains only of the following symptoms, and all other reviewed systems are negative.  See HPI  ALLERGIES: No Known Allergies  HOME MEDICATIONS: Outpatient  Medications Prior to Visit  Medication Sig Dispense Refill  . carbidopa-levodopa (SINEMET IR) 25-250 MG tablet Take 1 tablet by mouth 4 (four) times daily. 360 tablet 1  . clotrimazole-betamethasone (LOTRISONE) cream Apply 1 application topically as needed.    . donepezil (ARICEPT) 10 MG tablet TAKE 1 TABLET EVERY EVENING 90 tablet 2  . fenofibrate 160 MG tablet Take 160 mg by mouth daily.    . polyethylene glycol (MIRALAX / GLYCOLAX) packet Take 17 g by mouth daily as needed.     . vitamin B-12 (CYANOCOBALAMIN) 500 MCG tablet Take 500 mcg by mouth daily.     No facility-administered medications prior to visit.     PAST MEDICAL HISTORY: Past Medical History:  Diagnosis Date  . Chronic low back pain   . Chronic renal insufficiency   . Depression   . Dyslipidemia   . Frozen shoulder    Left  . HOH (hard of hearing)    bilateral hearing aids  . Hypertension   . Macular degeneration   . Memory disturbance   . Obesity   . Parkinson's disease (HCC)   . REM sleep behavior disorder   . Shingles    Right partietal shingles  . Strabismus     PAST SURGICAL HISTORY: Past Surgical History:  Procedure Laterality Date  . COLONOSCOPY W/ POLYPECTOMY    . DUPUYTREN CONTRACTURE RELEASE Bilateral   . EYE SURGERY Left    strabismus sx as child  FAMILY HISTORY: Family History  Problem Relation Age of Onset  . Cancer - Colon Mother   . Cancer Sister        Breast cancer  . Cancer - Prostate Brother     SOCIAL HISTORY: Social History   Socioeconomic History  . Marital status: Married    Spouse name: Not on file  . Number of children: 2  . Years of education: college  . Highest education level: Not on file  Occupational History  . Occupation: Retired    Comment: [employer]  Social Needs  . Financial resource strain: Not on file  . Food insecurity:    Worry: Not on file    Inability: Not on file  . Transportation needs:    Medical: Not on file    Non-medical: Not on  file  Tobacco Use  . Smoking status: Never Smoker  . Smokeless tobacco: Never Used  Substance and Sexual Activity  . Alcohol use: No    Alcohol/week: 0.0 standard drinks  . Drug use: No  . Sexual activity: Not on file  Lifestyle  . Physical activity:    Days per week: Not on file    Minutes per session: Not on file  . Stress: Not on file  Relationships  . Social connections:    Talks on phone: Not on file    Gets together: Not on file    Attends religious service: Not on file    Active member of club or organization: Not on file    Attends meetings of clubs or organizations: Not on file    Relationship status: Not on file  . Intimate partner violence:    Fear of current or ex partner: Not on file    Emotionally abused: Not on file    Physically abused: Not on file    Forced sexual activity: Not on file  Other Topics Concern  . Not on file  Social History Narrative   Lives at home   Married   Patient is right handed.   Patient does not drink caffeine.      PHYSICAL EXAM  Vitals:   12/04/17 1309  BP: (!) 144/86  Pulse: 64  Weight: 195 lb (88.5 kg)  Height: 6\' 2"  (1.88 m)   Body mass index is 25.04 kg/m.  MMSE - Mini Mental State Exam 12/04/2017 12/11/2016 11/14/2016  Not completed: - Unable to complete -  Orientation to time 3 - 4  Orientation to Place 4 - 5  Registration 3 - 2  Attention/ Calculation 0 - 0  Recall 2 - 3  Language- name 2 objects 2 - 2  Language- repeat 1 - 0  Language- follow 3 step command 3 - 3  Language- read & follow direction 1 - 1  Write a sentence 0 - 1  Copy design 0 - 1  Total score 19 - 22     Generalized: Well developed, in no acute distress   Neurological examination  Mentation: Alert oriented to time, place, history taking. Follows all commands speech and language fluent Cranial nerve II-XII: Pupils were equal round reactive to light. Extraocular movements were full, visual field were full on confrontational test. Facial  sensation and strength were normal. Uvula tongue midline. Head turning and shoulder shrug  were normal and symmetric. Motor: The motor testing reveals 5 over 5 strength of all 4 extremities. Good symmetric motor tone is noted throughout.  Prominent resting tremor noted in both upper extremities right greater than  left. Sensory: Sensory testing is intact to soft touch on all 4 extremities. No evidence of extinction is noted.  Coordination: Cerebellar testing mild ataxia with  finger-nose-finger and heel-to-shin bilaterally.  Gait and station: Patient needs a wheelchair today.  He does not have his walker with him. Reflexes: Deep tendon reflexes are symmetric and normal bilaterally.   DIAGNOSTIC DATA (LABS, IMAGING, TESTING) - I reviewed patient records, labs, notes, testing and imaging myself where available.      Component Value Date/Time   NA 136 06/03/2008 0838   K 4.4 06/03/2008 0838   CL 105 06/03/2008 0838   CO2 25 06/03/2008 0838   GLUCOSE 163 (H) 06/03/2008 0838   BUN 18 06/03/2008 0838   CREATININE 1.0 06/03/2008 0838   CALCIUM 9.0 06/03/2008 0838   GFRNONAA 78 06/03/2008 0838   GFRAA 94 06/03/2008 0838     ASSESSMENT AND PLAN 83 y.o. year old male  has a past medical history of Chronic low back pain, Chronic renal insufficiency, Depression, Dyslipidemia, Frozen shoulder, HOH (hard of hearing), Hypertension, Macular degeneration, Memory disturbance, Obesity, Parkinson's disease (HCC), REM sleep behavior disorder, Shingles, and Strabismus. here with:  1.  Parkinson's disease 2. Memory distubrance  Overall the patient is stable.  We discussed making adjustments to his Sinemet however they do not want to increase the dose at this time.  Will remain on Sinemet 25-100 mg 1 tablet 4 times a day.  His memory score was slightly declined.  We will continue to monitor.  He will continue on Aricept 10 mg at bedtime.  He is advised that if his symptoms worsen or he develops new symptoms  he should let us know. Will f/u in 6 months or sooner if needed     Butch Penny, MSN, NP-C 12/04/2017, 1:18 PM [facility] [address], [address] [place], [place] [address] [phone]

## 2017-12-04 NOTE — Progress Notes (Signed)
I have read the note, and I agree with the clinical assessment and plan.  Dany Harten K Lester Crickenberger   

## 2017-12-17 ENCOUNTER — Other Ambulatory Visit: Payer: Self-pay | Admitting: Adult Health

## 2017-12-17 NOTE — Telephone Encounter (Signed)
Patient has FU scheduled.

## 2018-01-13 ENCOUNTER — Other Ambulatory Visit: Payer: Self-pay | Admitting: Neurology

## 2018-03-13 ENCOUNTER — Encounter: Payer: Self-pay | Admitting: Podiatry

## 2018-03-13 ENCOUNTER — Ambulatory Visit (INDEPENDENT_AMBULATORY_CARE_PROVIDER_SITE_OTHER): Payer: Medicare Other | Admitting: Podiatry

## 2018-03-13 VITALS — BP 141/73 | HR 49 | Resp 16

## 2018-03-13 DIAGNOSIS — M79609 Pain in unspecified limb: Secondary | ICD-10-CM | POA: Diagnosis not present

## 2018-03-13 DIAGNOSIS — B351 Tinea unguium: Secondary | ICD-10-CM | POA: Diagnosis not present

## 2018-03-13 NOTE — Progress Notes (Signed)
   Subjective:    Patient ID: Anthony Lambert, male    DOB: 1934-10-08, 83 y.o.   MRN: [id number]  HPI    Review of Systems  All other systems reviewed and are negative.      Objective:   Physical Exam        Assessment & Plan:

## 2018-03-13 NOTE — Progress Notes (Signed)
Subjective:   Patient ID: Anthony Lambert, male   DOB: 83 y.o.   MRN: [id number]   HPI Patient presents with chronic ingrown toenail deformity right hallux that is been present for a long time and states that he is tried to soak it and tried to have a trend without relief of symptoms and that it is gradually more of an irritation for him.  Patient does not smoke likes to be active   Review of Systems  All other systems reviewed and are negative.       Objective:  Physical Exam  Constitutional: He appears well-developed and well-nourished.  Cardiovascular: Intact distal pulses.  Pulmonary/Chest: Effort normal.  Musculoskeletal: Normal range of motion.  Neurological: He is alert.  Skin: Skin is warm.  Nursing note and vitals reviewed.   Neurovascular status intact muscle strength is adequate range of motion was within normal limits with good digital perfusion noted.  Patient had warm toes and has incurvated right hallux medial border that is painful when pressed with no drainage or redness noted     Assessment:  Significant ingrown toenail deformity right hallux with pain     Plan:  H&P discussed issue and recommended removal of the corner which patient wants and is motivated to have it fixed.  Patient read and then signed consent form understanding risk and today I infiltrated the right hallux 60 mg like Marcaine mixture remove the medial border exposed matrix and applied phenol 3 applications 30 seconds followed by alcohol lavage sterile dressing.  Gave instructions on soaks and reappoint

## 2018-03-13 NOTE — Patient Instructions (Signed)

## 2018-04-11 ENCOUNTER — Ambulatory Visit: Payer: Medicare Other | Admitting: Podiatry

## 2018-04-17 ENCOUNTER — Ambulatory Visit (INDEPENDENT_AMBULATORY_CARE_PROVIDER_SITE_OTHER): Payer: Medicare Other | Admitting: Podiatry

## 2018-04-17 ENCOUNTER — Encounter: Payer: Self-pay | Admitting: Podiatry

## 2018-04-17 DIAGNOSIS — L600 Ingrowing nail: Secondary | ICD-10-CM | POA: Diagnosis not present

## 2018-04-17 MED ORDER — NEOMYCIN-POLYMYXIN-HC 3.5-10000-1 OT SOLN: OTIC | 0 refills | Status: AC

## 2018-04-17 NOTE — Patient Instructions (Signed)

## 2018-04-18 NOTE — Progress Notes (Signed)
Subjective:   Patient ID: Anthony Lambert, male   DOB: 84 y.o.   MRN: [id number]   HPI Patient presents stating the border that was fixed is doing very well but the other side of my nail has become very sore and it is hard for me to wear shoe gear.  Patient has tried to soak it without relief of symptoms   ROS      Objective:  Physical Exam  Neurovascular status found to be intact with good digital perfusion with patient found to have incurvated lateral border of the right hallux that is painful when pressed and is making shoe gear difficult.  There is no drainage or redness noted in the medial side has healed well from previous nail surgery     Assessment:  Chronic ingrown toenail deformity right hallux lateral border that is painful when pressed     Plan:  H&P condition reviewed and recommended correction of deformity.  I explained procedure to him and caregiver and they read and signed consent form understanding risk.  Today I infiltrated the right hallux 60 mg like Marcaine mixture sterile prep applied to the toe and using sterile instrumentation the medial border of the hallux was excised the base was exposed and phenol 3 applications 30 seconds followed by alcohol lavage and sterile dressing administered.  Gave instructions for soaks advised to leaving the dressing off on 24 hours but to take it off earlier if any throbbing should occur and wrote prescription for Corticosporin otic solution.  Encouraged to call with any questions concerns and will be seen back 2 weeks for recheck

## 2018-05-22 ENCOUNTER — Ambulatory Visit: Payer: Medicare Other | Admitting: Podiatry

## 2018-06-25 ENCOUNTER — Ambulatory Visit: Payer: Medicare Other | Admitting: Neurology

## 2018-06-26 ENCOUNTER — Telehealth: Payer: Self-pay

## 2018-06-26 NOTE — Telephone Encounter (Signed)
VM left to offer to schedule visit with Palliative Care 

## 2018-07-09 ENCOUNTER — Telehealth: Payer: Self-pay

## 2018-07-09 NOTE — Telephone Encounter (Signed)
Phone call placed to patient. Spoke with both patient and wife, Eber Jones, to introduce Palliative Care and to offer to schedule a visit.  Due to the current COVID-19 infection/crises, the patient and family prefer, and have given their verbal consent for, a provider visit via telemedicine. HIPPA policies of confidentially were discussed and patient/family expressed understanding. Visit scheduled for 07/10/2018 @ 11am

## 2018-07-10 ENCOUNTER — Other Ambulatory Visit: Payer: Self-pay

## 2018-07-10 ENCOUNTER — Other Ambulatory Visit: Payer: Medicare Other | Admitting: Internal Medicine

## 2018-07-10 DIAGNOSIS — Z515 Encounter for palliative care: Secondary | ICD-10-CM

## 2018-07-10 NOTE — Progress Notes (Signed)
[facility], [facility] Consult Note Telephone: [phone]  Fax: [phone]  PATIENT NAME: Anthony Lambert DOB: 1935/03/30 MRN: [id number]  PRIMARY CARE PROVIDER:   Iona HansenJones, Anthony L, NP  REFERRING PROVIDER:  Iona HansenJones, Anthony L, NP [address] [place], [place] [address]  [phone]      FAX  [phone]  RESPONSIBLE PARTY:   Self and Anthony JonesCarolyn Lambert(wife/HCPOA) [phone]    RECOMMENDATIONS and PLAN: Palliative Care encounter  Z51.5   1. Dysphagia:  Related to advanced Parkinson's. High risk of aspiration and pneumonia. Purchase and begin use of thickener in all beverages.  Mechanical soft solid foods.  Wife will adjust consistency of beverages prn.  Aspiration precautions reviewed.  Continue to monitor.  2.  Weight loss:  8 # wt loss from Dec to Jan 2020. More than likely related to decreased intake from dysphagia.  BMI in Jan was 24. Weigh weekly if pt is able to stand safely.  Supplement with protein shakes/puddings prn.  Monitor  3. Instability of gait with falls:  Related to advanced Parkinsons.  Currently uses walker but would benefit from use of wheelchair and hospital bed in the home to prevent recurrent falls. Place a foam mattress on floor beside bed due to recurrent falls when getting in and out of bed.  4. Incontinent of urine:  Skin barrier ointment use for protection.  Encouraged use of adult briefs or inserts. Limit night time beverage intake.  Monitor skin.   5. Advanced care planning:  Introduced Palliative vs Hospice care services to pt, wife and sons.  Advanced directives reviewed (MOST form) with the following selections made by patient:  DNAR/DNI, Comfort care, antibiotics if indicated, IV fluids for trial period and no tube feedings. MOST and DNR forms completed.  Pt and wife agreed with them being left in their mailbox in a secured envelope.  His goals of care are to remain at home with care assistance provided by wife  and hired caregiver 2x/week. No plans on returning to the hospital unless there is an injury that would benefit from treatments.  Encouraged to inquire if he is eligible for additional home services through the [facility]. He consents to additional F/U from palliative care.  Next visit via Telehealth on 07/24/18.    Due to the COVID-19 crisis, this visit was done via telephonic evaluation from my office and it was initiated and consented by this patient and or family. I spent 45 minutes providing this consultation,  from 1100 to 1145. More than 50% of the time in this consultation was spent coordinating communication with patient, wife and sons Anthony Lambert and Anthony Lambert via phone. Phoned PCP off and spoke to clinical staff to request order for hospital bed and wheelchair for home use.  HISTORY OF PRESENT ILLNESS:  Anthony Anthony Lambert is a 84 y.o. year old male with multiple medical problems including advanced Parkinson disease with associated memory loss, dysphagia and instability of gait and multiple falls entering or exiting his bed.  He requires use of a walker for mobility. Mobile no greater than 20 ft.Wife reports poor eyesight due to macular degeneration and cataracts. He feeds self but coughs with each swallow of food or hydration.  He is incontinent of urine but declines to wear briefs or adult underwear. Palliative Care was asked to help address goals of care after patient was evaluated for Hospice care and he did not meet criteria at this time.Marland Kitchen.   CODE STATUS: DNAR/DNI  PPS: 40%  Ambulates with walker to restroom HOSPICE ELIGIBILITY/DIAGNOSIS: TBD  PAST MEDICAL HISTORY:  Past Medical History:  Diagnosis Date  . Chronic low back pain   . Chronic renal insufficiency   . Depression   . Dyslipidemia   . Frozen shoulder    Left  . HOH (hard of hearing)    bilateral hearing aids  . Hypertension   . Macular degeneration   . Memory disturbance   . Obesity   . Parkinson's disease (HCC)   . REM sleep  behavior disorder   . Shingles    Right partietal shingles  . Strabismus     PHYSICAL EXAM:   General:Unable to visualize Pulmonary: No audible wheezing or shortness of breath with speech Extremities:Unable to visualize Neurological: Oriented x 2.  Muffled voice  Anthony Sheffield, NP-C

## 2018-07-11 ENCOUNTER — Encounter: Payer: Self-pay | Admitting: Internal Medicine

## 2018-07-11 DIAGNOSIS — Z515 Encounter for palliative care: Secondary | ICD-10-CM | POA: Insufficient documentation

## 2018-07-24 ENCOUNTER — Other Ambulatory Visit: Payer: Self-pay

## 2018-07-24 ENCOUNTER — Other Ambulatory Visit: Payer: Medicare Other | Admitting: Internal Medicine

## 2018-07-24 DIAGNOSIS — Z515 Encounter for palliative care: Secondary | ICD-10-CM

## 2018-07-25 NOTE — Progress Notes (Signed)
    Therapist, nutritional Palliative Care Consult Note Telephone: [phone]  Fax: [phone]  PATIENT NAME: Anthony Lambert DOB: 1934-09-11 MRN: [id number]  PRIMARY CARE PROVIDER:   Iona Hansen, NP  REFERRING PROVIDER:  Iona Hansen, NP [address] [place], [place] [address]  RESPONSIBLE PARTY:   Self and wife Anthony Lambert [phone]     RECOMMENDATIONS and PLAN:   Palliative Care Encounter  Z51.5  1. Advance Care Planning: DNAR and MOST forms in home.  Living will previously completed and attached in medical records.  "Natural death" selected.  Goals of care are to remain at home and receive assistance from wife and son. Avoid hospitalization. He does not desire to escalate care. Advanced care planning will continue to follow.   2.  Dysphagia:  Unchanged.  Continues to decline use of thickener. High probability of aspiration. Reinforced aspiration precautions.  3. Instability of gait: Additional decline.  Attempting to prevent falls.  No expectation of improvement with advancement of Parkinsons.  4.  Memory loss without behaviors:  Related to advanced Parkinson's disease.  FAST stage 7A.    Due to the COVID-19 crisis, this visit was performed from my office and was initiated and consented by this patient and or family.     I spent 30 minutes providing this consultation,  from 1100 to 1130. More than 50% of the time in this consultation was spent coordinating communication with patient and wife.   HISTORY OF PRESENT ILLNESS: F/U with Anthony Lambert.  Wife and patient report that he continues to have challenges with eating, swallowing and mobilization.  Palliative Care was asked to help address goals of care.   CODE STATUS: DNAR/DNI                              MOST: comfort measures, antibiotics if indicated, IV fluids for a trial period and no tube feedings  PPS: 40%  HOSPICE ELIGIBILITY/DIAGNOSIS: TBD  PAST MEDICAL HISTORY:  Past  Medical History:  Diagnosis Date  . Chronic low back pain   . Chronic renal insufficiency   . Depression   . Dyslipidemia   . Frozen shoulder    Left  . HOH (hard of hearing)    bilateral hearing aids  . Hypertension   . Macular degeneration   . Memory disturbance   . Obesity   . Parkinson's disease (HCC)   . REM sleep behavior disorder   . Shingles    Right partietal shingles  . Strabismus       PHYSICAL EXAM:   General: Chronically ill and  frail appearing, thin Pulmonary: no increased respiratory effort Abdomen: nondistended Extremities: no edema, no joint deformities Skin: exposed skin is intact Neurological: Alert, some muffling of speech, weakness   Anthony Sheffield, NP-C

## 2018-07-30 ENCOUNTER — Telehealth: Payer: Self-pay

## 2018-07-30 NOTE — Telephone Encounter (Signed)
Received update regarding request for w/c and hospital bed. DME provider requiring further information. Information sent to [employer] [facility].

## 2018-08-01 ENCOUNTER — Telehealth: Payer: Self-pay

## 2018-08-01 NOTE — Telephone Encounter (Signed)
Phone call placed to [employer] regarding request for DME and needed information. [employer] [facility] faxed request to Palliative care instead of PCP office. [employer] [facility] to fax request to PCP office for further clarification.

## 2018-10-22 ENCOUNTER — Telehealth: Payer: Self-pay

## 2018-10-22 NOTE — Telephone Encounter (Signed)
Phone call placed to patient's wife to check in and to schedule visit with NP. Visit scheduled for 11/05/2018 @ 3pm

## 2018-11-05 ENCOUNTER — Other Ambulatory Visit: Payer: Medicare Other | Admitting: Internal Medicine

## 2018-11-05 ENCOUNTER — Other Ambulatory Visit: Payer: Self-pay

## 2018-11-05 DIAGNOSIS — Z515 Encounter for palliative care: Secondary | ICD-10-CM

## 2018-11-05 NOTE — Progress Notes (Signed)
    [facility] Consult Note Telephone: [phone]  Fax: [phone]  PATIENT NAME: Anthony Lambert DOB: [birth year] MRN: [id number]  PRIMARY CARE PROVIDER:   Berkley Harvey, NP  REFERRING PROVIDER:  Berkley Harvey, NP [place], [place] [address]  [phone]      [phone]  RESPONSIBLE PARTY:   Self and Anthony Lambert(wife/HCPOA) [phone]    RECOMMENDATIONS and PLAN: Palliative Care encounter  Z51.5  1. Advance care planning:  DNAR and MOST form in home. Goals are for comfort care.  No plans for return to hospital. Comfort measures.Transition to Hospice care at home with additional overall decline.   2. Dysphagia:  Progressive decline and refusal to use thickener in liquids or modify food textures.  Comfort foods.  At risk of recurrent aspiration PNA. . 3.  Weight loss: Progressive decline.  Visible signs of weight loss without ability to weigh related to unsteady gait. 8 # wt loss from Dec to Jan 2020.to 188#    4. Instability of gait:  Related to advanced Parkinsons disease. Fall risk prevention reviewed.  He would benefit from wheelchair use.   I spent 45 minutes providing this consultation,  from 1500 to 1545 More than 50% of the time in this consultation was spent coordinating communication with patient, wife and son Anthony Lambert via phone.   HISTORY OF PRESENT ILLNESS: F/U with Anthony Lambert. Wife reports decrease of cognitive and functional abilities. He requires assistance in rising to standing position for transfers. Intermittent falls.  He eats very little and coughs with swallowing food and beverages.  Palliative Care was asked to help address goals of care  CODE STATUS: DNAR/DNI  PPS: 30%   HOSPICE ELIGIBILITY/DIAGNOSIS: YES/ End stage Parkinson Disease  PAST MEDICAL HISTORY:  Past Medical History:  Diagnosis Date  . Chronic low back pain   . Chronic renal insufficiency   . Depression   .  Dyslipidemia   . Frozen shoulder    Left  . HOH (hard of hearing)    bilateral hearing aids  . Hypertension   . Macular degeneration   . Memory disturbance   . Obesity   . Parkinson's disease ([place])   . REM sleep behavior disorder   . Shingles    Right partietal shingles  . Strabismus     PHYSICAL EXAM:   General:Chronically ill and thin appearing elderly male sitting in recliner CV:  RRR Pulmonary: Rhonchi of upper and lower lung fields.  Congested cough ABD:  Soft with active BS Extremities:No edema. SKIN:  Healing abrasions of BLE Neurological: Oriented x 2.  Muffled voice.  Generalized weakness  Gonzella Lex, NP-C

## 2018-11-27 ENCOUNTER — Telehealth: Payer: Self-pay | Admitting: Licensed Clinical Social Worker

## 2018-11-27 NOTE — Telephone Encounter (Signed)
Palliative Care SW called patient's son, Shanon Brow, since SW was unable to contact patient's wife, Chrys Racer.  He stated he has been with his parents for the past hour and was aware that his mother spoke with her NP.  He reports that he and his brother have been encouraging his parents to hire private caregivers for patient, but they decline.  He states they are financially able to do so.  He also stated patient has a cough and would like him to be seen by Palliative Care.  SW relayed the information to the Clinical Navigator.  Patient's home number is [phone]) [id number].

## 2019-02-05 ENCOUNTER — Encounter: Payer: Self-pay | Admitting: Podiatry

## 2019-02-05 ENCOUNTER — Other Ambulatory Visit: Payer: Self-pay

## 2019-02-05 ENCOUNTER — Ambulatory Visit (INDEPENDENT_AMBULATORY_CARE_PROVIDER_SITE_OTHER): Payer: Medicare Other | Admitting: Podiatry

## 2019-02-05 DIAGNOSIS — M79674 Pain in right toe(s): Secondary | ICD-10-CM

## 2019-02-05 DIAGNOSIS — M79675 Pain in left toe(s): Secondary | ICD-10-CM

## 2019-02-05 DIAGNOSIS — B351 Tinea unguium: Secondary | ICD-10-CM | POA: Diagnosis not present

## 2019-02-05 NOTE — Progress Notes (Signed)
Complaint:  Visit Type: Patient returns to my office for continued preventative foot care services. Complaint: Patient states" my nails have grown long and thick and become painful to walk and wear shoes" Patient has been diagnosed with Parkinsons. The patient presents for preventative foot care services.  Podiatric Exam: Vascular: dorsalis pedis and posterior tibial pulses are palpable bilateral. Capillary return is immediate. Temperature gradient is WNL. Skin turgor WNL  Sensorium: Normal Semmes Weinstein monofilament test. Normal tactile sensation bilaterally. Nail Exam: Pt has thick disfigured discolored nails with subungual debris noted bilateral entire nail hallux through fifth toenails.  Nail surgery has healed uneventfully. Ulcer Exam: There is no evidence of ulcer or pre-ulcerative changes or infection. Orthopedic Exam: Muscle tone and strength are WNL. No limitations in general ROM. No crepitus or effusions noted. Foot type and digits show no abnormalities. Bony prominences are unremarkable. Skin: No Porokeratosis. No infection or ulcers  Diagnosis:  Onychomycosis, , Pain in right toe, pain in left toes  Treatment & Plan Procedures and Treatment: Consent by patient was obtained for treatment procedures.   Debridement of mycotic and hypertrophic toenails, 1 through 5 bilateral and clearing of subungual debris. No ulceration, no infection noted.  Return Visit-Office Procedure: Patient instructed to return to the office for a follow up visit 3 months for continued evaluation and treatment.    Gardiner Barefoot DPM

## 2019-03-27 IMAGING — CT CT HEAD W/O CM
3 series · 15 of 47 positions shown, 18 images · non-contrast
Comparison: No comparison studies available.

CLINICAL DATA: Tripped and fell on the parking lot. Hematoma and
abrasion to right forehead.

EXAM:
CT HEAD WITHOUT CONTRAST
TECHNIQUE: Contiguous axial images were obtained from the base of the skull
through the vertex without intravenous contrast.

[Series 2: head wo · axial · 0.52mm/px · z∈[+902,+1062]mm · 9 of 38 slices shown, 12 images]
[im 3/38  brain]
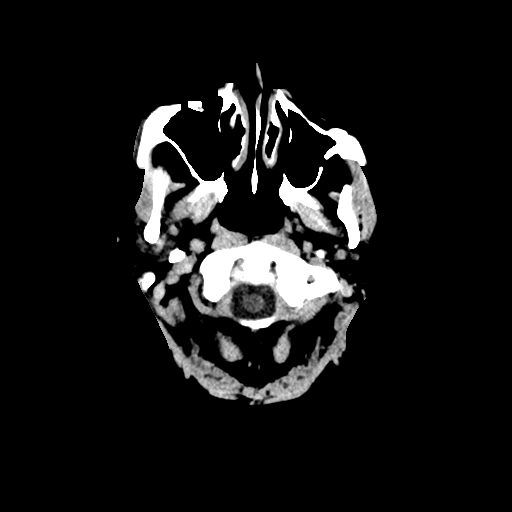
[im 3/38  bone]
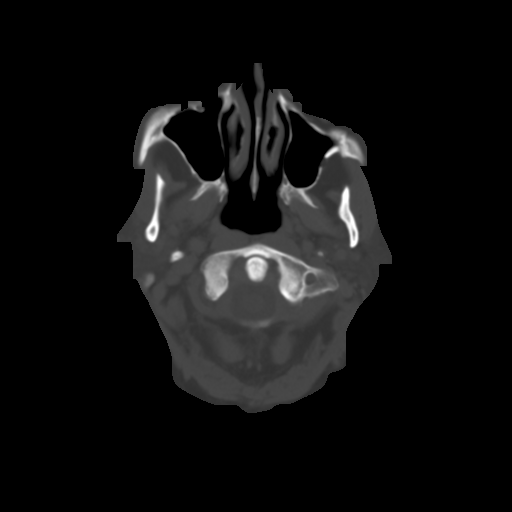
[im 7/38  brain]
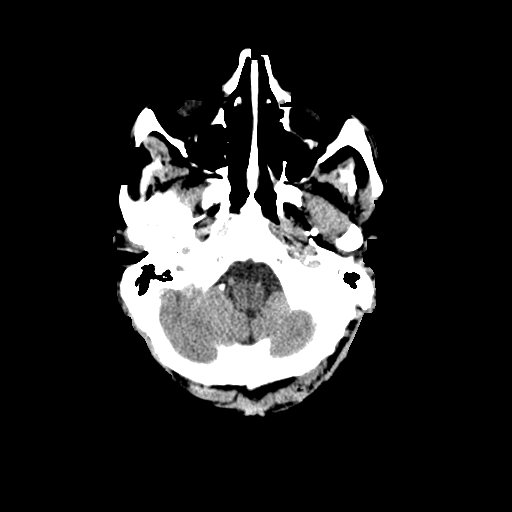
[im 11/38  brain]
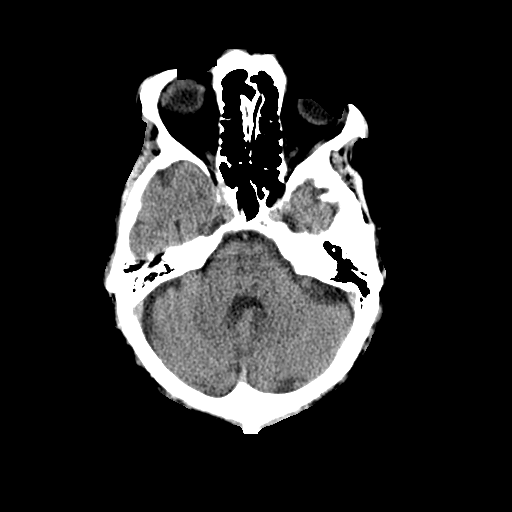
[im 15/38  brain]
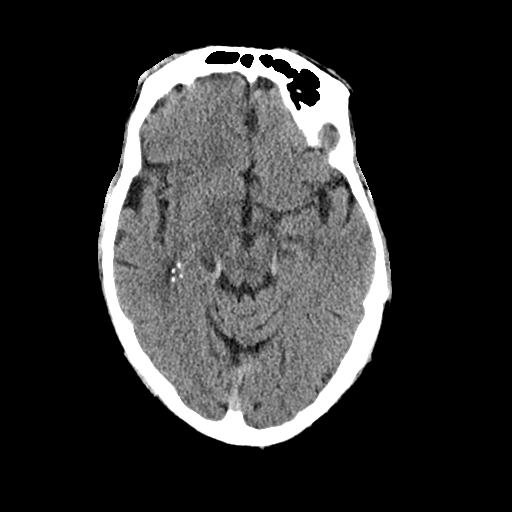
[im 20/38  brain]
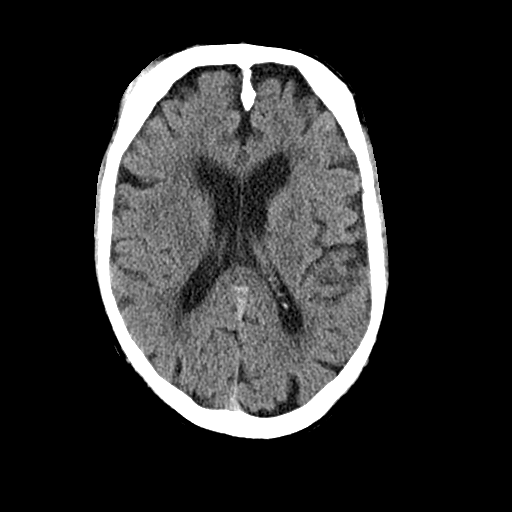
[im 20/38  bone]
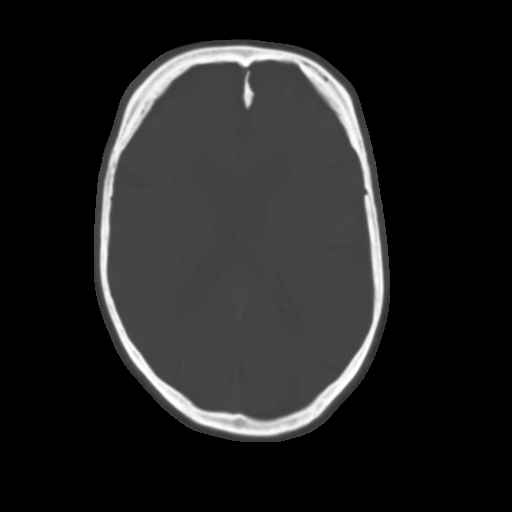
[im 23/38  brain]
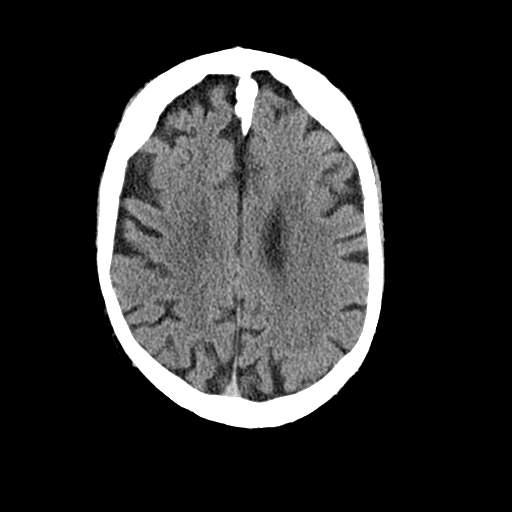
[im 27/38  brain]
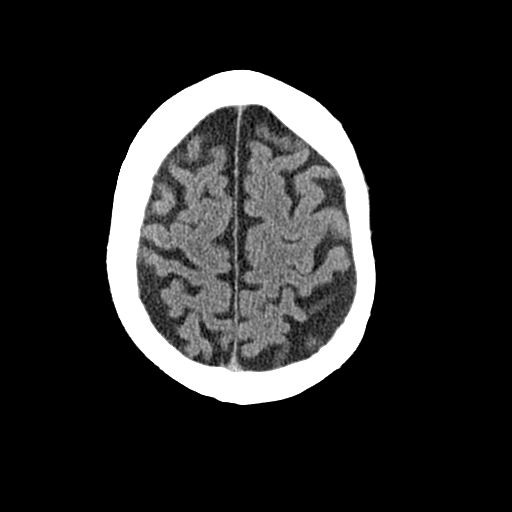
[im 31/38  brain]
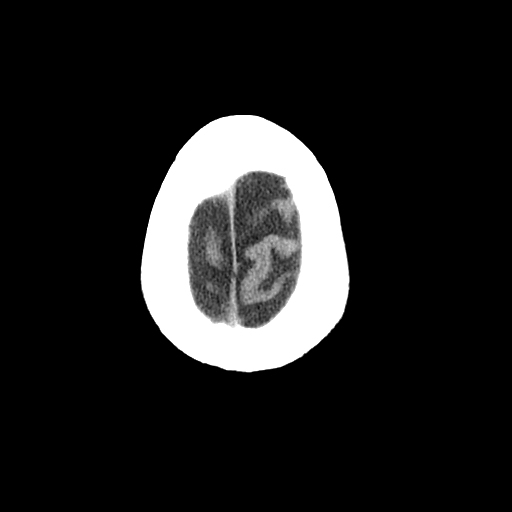
[im 35/38  brain]
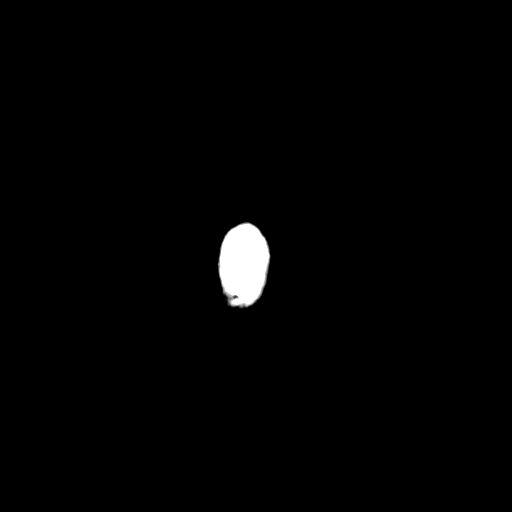
[im 35/38  bone]
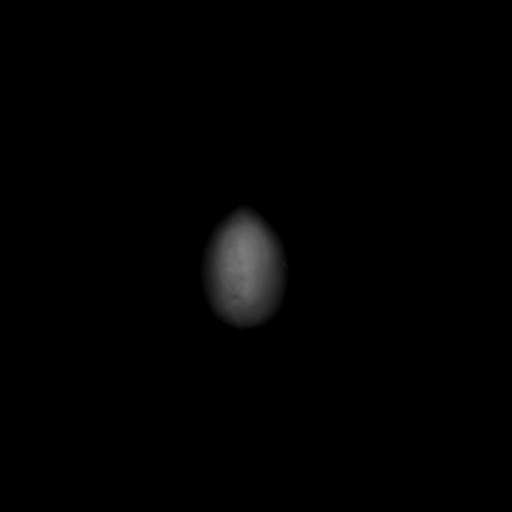

[Series 4: cor soft · coronal · 0.36mm/px · 3 of 84 slices shown]
[im 28/84  brain]
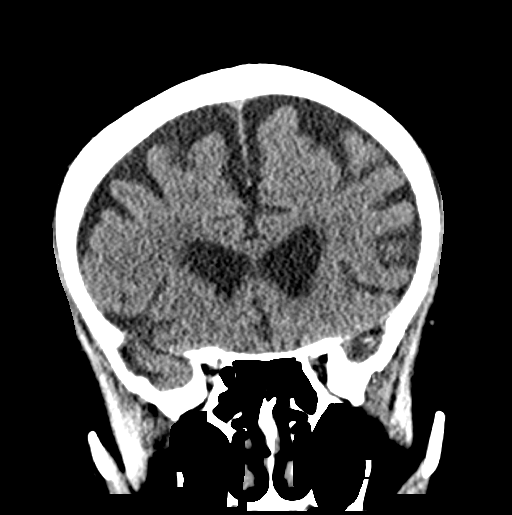
[im 37/84  brain]
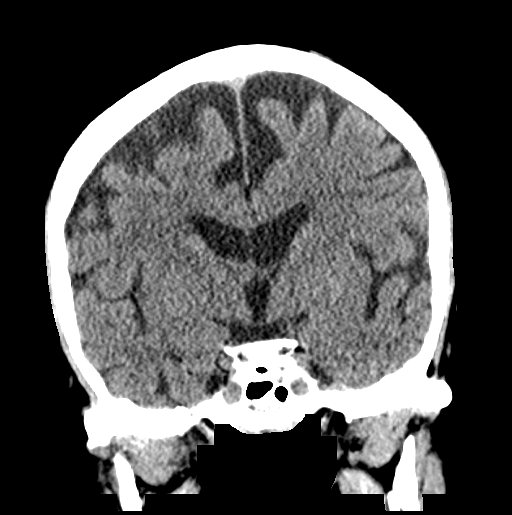
[im 47/84  brain]
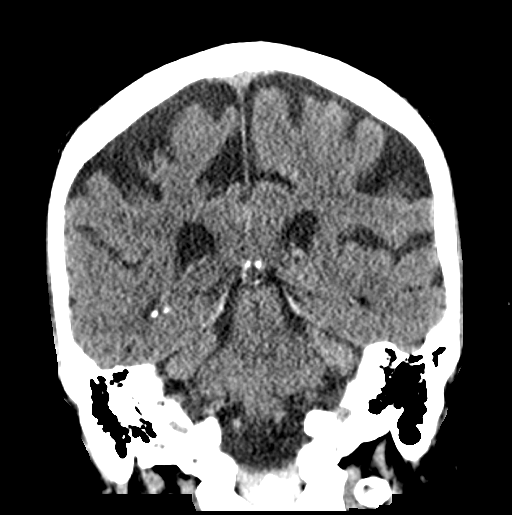

[Series 5: sag soft · sagittal · 0.36mm/px · 3 of 62 slices shown]
[im 21/62  brain]
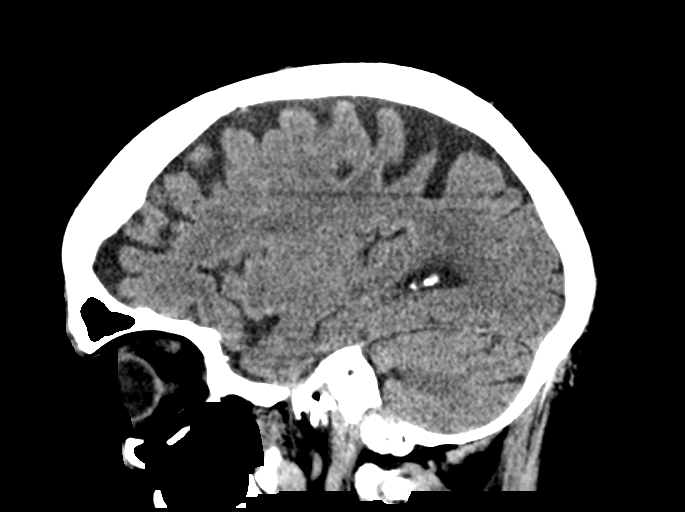
[im 31/62  brain]
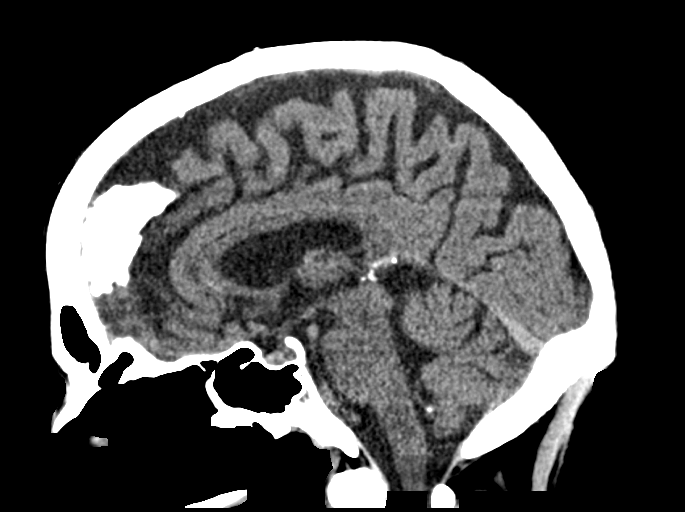
[im 41/62  brain]
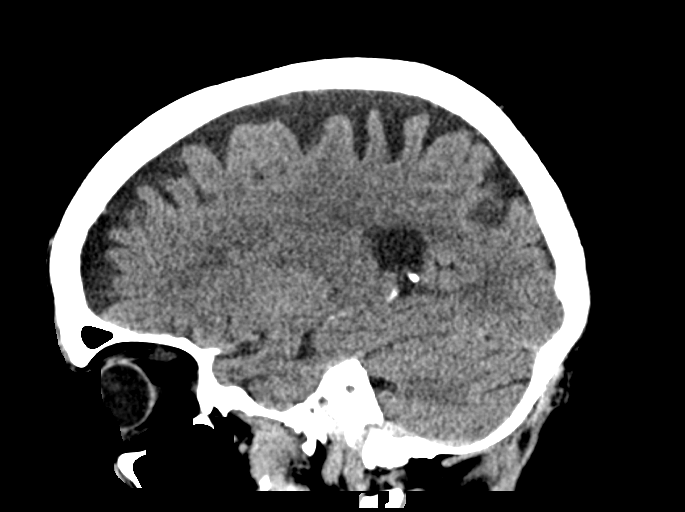

[15 of 47 positions shown; findings below may reference images not displayed]

FINDINGS: Brain: There is no evidence for acute hemorrhage, hydrocephalus,
mass lesion, or abnormal extra-axial fluid collection. No definite
CT evidence for acute infarction. Diffuse loss of parenchymal volume
is consistent with atrophy. Patchy low attenuation in the deep
hemispheric and periventricular white matter is nonspecific, but
likely reflects chronic microvascular ischemic demyelination.

Vascular: No hyperdense vessel or unexpected calcification.

Skull: No evidence for fracture. No worrisome lytic or sclerotic
lesion.

Sinuses/Orbits: The visualized paranasal sinuses and mastoid air
cells are clear. Visualized portions of the globes and intraorbital
fat are unremarkable.

Other: Right frontal scalp contusion evident.
IMPRESSION: 1. No acute intracranial abnormality.
2. Atrophy with chronic small vessel white matter ischemic disease.

## 2019-10-15 ENCOUNTER — Emergency Department (HOSPITAL_COMMUNITY): Payer: Medicare Other

## 2019-10-15 ENCOUNTER — Encounter (HOSPITAL_COMMUNITY): Payer: Self-pay

## 2019-10-15 ENCOUNTER — Other Ambulatory Visit: Payer: Self-pay

## 2019-10-15 ENCOUNTER — Emergency Department (HOSPITAL_COMMUNITY)
Admission: EM | Admit: 2019-10-15 | Discharge: 2019-10-15 | Disposition: A | Discharge status: Home / Self Care | Payer: Medicare Other | Attending: Emergency Medicine | Admitting: Emergency Medicine

## 2019-10-15 DIAGNOSIS — Z79899 Other long term (current) drug therapy: Secondary | ICD-10-CM | POA: Diagnosis not present

## 2019-10-15 DIAGNOSIS — R079 Chest pain, unspecified: Secondary | ICD-10-CM

## 2019-10-15 DIAGNOSIS — R0789 Other chest pain: Secondary | ICD-10-CM | POA: Diagnosis present

## 2019-10-15 DIAGNOSIS — Z7982 Long term (current) use of aspirin: Secondary | ICD-10-CM | POA: Insufficient documentation

## 2019-10-15 DIAGNOSIS — G20 Parkinson's disease: Secondary | ICD-10-CM | POA: Diagnosis not present

## 2019-10-15 DIAGNOSIS — I10 Essential (primary) hypertension: Secondary | ICD-10-CM | POA: Diagnosis not present

## 2019-10-15 LAB — BASIC METABOLIC PANEL
Anion gap: 11 (ref 5–15)
BUN: 13 mg/dL (ref 8–23)
CO2: 23 mmol/L (ref 22–32)
Calcium: 8.4 mg/dL — ABNORMAL LOW (ref 8.9–10.3)
Chloride: 102 mmol/L (ref 98–111)
Creatinine, Ser: 0.85 mg/dL (ref 0.61–1.24)
GFR calc Af Amer: >60 mL/min (ref >60)
GFR calc non Af Amer: >60 mL/min (ref >60)
Glucose, Bld: 96 mg/dL (ref 70–99)
Potassium: 3.7 mmol/L (ref 3.5–5.1)
Sodium: 136 mmol/L (ref 135–145)

## 2019-10-15 LAB — CBC WITH DIFFERENTIAL/PLATELET
Abs Immature Granulocytes: 0.02 10*3/uL (ref 0.00–0.07)
Basophils Absolute: 0 10*3/uL (ref 0.0–0.1)
Basophils Relative: 1 %
Eosinophils Absolute: 0.3 10*3/uL (ref 0.0–0.5)
Eosinophils Relative: 4 %
HCT: 39.7 % (ref 39.0–52.0)
Hemoglobin: 13.6 g/dL (ref 13.0–17.0)
Immature Granulocytes: 0 %
Lymphocytes Relative: 47 %
Lymphs Abs: 3.4 10*3/uL (ref 0.7–4.0)
MCH: 32.9 pg (ref 26.0–34.0)
MCHC: 34.3 g/dL (ref 30.0–36.0)
MCV: 95.9 fL (ref 80.0–100.0)
Monocytes Absolute: 0.8 10*3/uL (ref 0.1–1.0)
Monocytes Relative: 11 %
Neutro Abs: 2.7 10*3/uL (ref 1.7–7.7)
Neutrophils Relative %: 37 %
Platelets: 146 10*3/uL — ABNORMAL LOW (ref 150–400)
RBC: 4.14 MIL/uL — ABNORMAL LOW (ref 4.22–5.81)
RDW: 12.4 % (ref 11.5–15.5)
WBC: 7.2 10*3/uL (ref 4.0–10.5)
nRBC: 0 % (ref 0.0–0.2)

## 2019-10-15 LAB — TROPONIN I (HIGH SENSITIVITY): Troponin I (High Sensitivity): 10 ng/L (ref <18)

## 2019-10-15 MED ORDER — ASPIRIN 81 MG PO CHEW: 81.0000 mg | CHEWABLE_TABLET | Freq: Every day | ORAL | 0 refills | Status: AC

## 2019-10-15 NOTE — ED Notes (Signed)
Son at bedside, took belongings and is picking pt up. Given D/C papers and verbalized understanding.

## 2019-10-15 NOTE — ED Notes (Signed)
X-ray at bedside

## 2019-10-15 NOTE — ED Provider Notes (Signed)
[facility] EMERGENCY DEPARTMENT Provider Note   CSN: [id number] Arrival date & time: 10/15/19  1704     History Chief Complaint  Patient presents with  . Chest Pain    Anthony Lambert is a 85 y.o. male.  85 yo M with a chief complaint of chest pain. This started earlier today and is resolved. Patient has trouble describing what it felt like. It sounds like it was severe and EMS was called. They are concerned that he was having a posterior STEMI and he was brought to the hospital under a code STEMI. Patient is a DNR and is currently on hospice service. When this was found out the STEMI code was canceled. He also had resolution of his symptoms and resolution of his EKG changes. Currently has no pain. Denies cough denies trauma. Denies nausea vomiting or diarrhea.  The history is provided by the patient.  Chest Pain Pain location:  Unable to specify Pain radiates to:  Does not radiate Pain severity:  Moderate Onset quality:  Sudden Duration:  2 hours Timing:  Rare Progression:  Resolved Chronicity:  New Relieved by:  Nothing Worsened by:  Nothing Ineffective treatments:  None tried Associated symptoms: no abdominal pain, no fever, no headache, no palpitations, no shortness of breath and no vomiting        Past Medical History:  Diagnosis Date  . Chronic low back pain   . Chronic renal insufficiency   . Depression   . Dyslipidemia   . Frozen shoulder    Left  . HOH (hard of hearing)    bilateral hearing aids  . Hypertension   . Macular degeneration   . Memory disturbance   . Obesity   . Parkinson's disease (HCC)   . REM sleep behavior disorder   . Shingles    Right partietal shingles  . Strabismus     Patient Active Problem List   Diagnosis Date Noted  . Pain due to onychomycosis of toenails of both feet 02/05/2019  . Palliative care encounter 07/11/2018  . Tinea cruris 05/26/2015  . Urinary incontinence without sensory awareness 05/26/2015    . Risk for falls 05/17/2015  . Chronic low back pain 10/26/2014  . Pain in lower limb 03/31/2014  . Onychomycosis 10/29/2013  . Painful legs and moving toes 10/29/2013  . Hypertension, benign 06/04/2013  . Body mass index 33.0-33.9, adult 05/30/2013  . Problems influencing health status 05/30/2013  . Osteoarthrosis 02/19/2013  . Constipation 09/07/2012  . Macular degeneration (senile) of retina 05/07/2012  . Dysphagia, unspecified(787.20) 04/19/2012  . Paralysis agitans (HCC) 04/19/2012  . REM sleep behavior disorder 04/19/2012  . Abnormality of gait 04/19/2012  . Sprain of lumbosacral (joint) (ligament) 04/19/2012  . Memory loss 04/19/2012  . Dysphagia 04/19/2012  . Hyperlipidemia 09/04/2011  . Depressive disorder 11/03/2010  . History of colonic polyps 11/03/2010    Past Surgical History:  Procedure Laterality Date  . COLONOSCOPY W/ POLYPECTOMY    . DUPUYTREN CONTRACTURE RELEASE Bilateral   . EYE SURGERY Left    strabismus sx as child       Family History  Problem Relation Age of Onset  . Cancer - Colon Mother   . Cancer Sister        Breast cancer  . Cancer - Prostate Brother     Social History   Tobacco Use  . Smoking status: Never Smoker  . Smokeless tobacco: Never Used  Substance Use Topics  . Alcohol use: No  Alcohol/week: 0.0 standard drinks  . Drug use: No    Home Medications Prior to Admission medications   Medication Sig Start Date End Date Taking? Authorizing Provider  aspirin 81 MG chewable tablet Chew 1 tablet (81 mg total) by mouth daily. 10/15/19   Melene PlanFloyd, Tanairy Payeur, DO  carbidopa-levodopa (SINEMET IR) 25-250 MG tablet TAKE 1 TABLET FOUR TIMES A DAY 01/14/18   Dohmeier, Porfirio Mylararmen, MD  donepezil (ARICEPT) 10 MG tablet Take 1 tablet (10 mg total) by mouth at bedtime. 12/17/17   York SpanielWillis, Charles K, MD  fenofibrate 160 MG tablet Take 160 mg by mouth daily.    [provider]  hydrocortisone (ANUSOL-HC) 25 MG suppository Place rectally. 11/22/18    [provider]  neomycin-polymyxin-hydrocortisone (CORTISPORIN) OTIC solution Apply 1-2 drops to toe after soaking twice a day 04/17/18   Lenn Sinkegal, Norman S, DPM  polyethylene glycol (MIRALAX / GLYCOLAX) packet Take 17 g by mouth daily as needed.     [provider]  UNABLE TO FIND Lightweight wheelchair / cushion/ foot rests 07/11/18   [provider]  vitamin B-12 (CYANOCOBALAMIN) 500 MCG tablet Take 500 mcg by mouth daily.    [provider]    Allergies    Patient has no known allergies.  Review of Systems   Review of Systems  Constitutional: Negative for chills and fever.  HENT: Negative for congestion and facial swelling.   Eyes: Negative for discharge and visual disturbance.  Respiratory: Negative for shortness of breath.   Cardiovascular: Positive for chest pain. Negative for palpitations.  Gastrointestinal: Negative for abdominal pain, diarrhea and vomiting.  Musculoskeletal: Negative for arthralgias and myalgias.  Skin: Negative for color change and rash.  Neurological: Negative for tremors, syncope and headaches.  Psychiatric/Behavioral: Negative for confusion and dysphoric mood.    Physical Exam Updated Vital Signs BP 138/73 (BP Location: Right Arm)   Pulse (!) 53   Temp 97.8 F (36.6 C) (Oral)   Resp (!) 22   SpO2 95%   Physical Exam Vitals and nursing note reviewed.  Constitutional:      Appearance: He is well-developed.  HENT:     Head: Normocephalic and atraumatic.  Eyes:     Pupils: Pupils are equal, round, and reactive to light.     Comments: Disconjugate gaze  Neck:     Vascular: No JVD.  Cardiovascular:     Rate and Rhythm: Normal rate and regular rhythm.     Heart sounds: No murmur heard.  No friction rub. No gallop.   Pulmonary:     Effort: No respiratory distress.     Breath sounds: No wheezing.  Abdominal:     General: There is no distension.     Tenderness: There is no guarding or rebound.  Musculoskeletal:         General: Normal range of motion.     Cervical back: Normal range of motion and neck supple.  Skin:    Coloration: Skin is not pale.     Findings: No rash.  Neurological:     Mental Status: He is alert and oriented to person, place, and time.     Comments: Abnormal movements  Psychiatric:        Behavior: Behavior normal.     ED Results / Procedures / Treatments   Labs (all labs ordered are listed, but only abnormal results are displayed) Labs Reviewed  CBC WITH DIFFERENTIAL/PLATELET - Abnormal; Notable for the following components:      Result Value  RBC 4.14 (*)    Platelets 146 (*)    All other components within normal limits  BASIC METABOLIC PANEL - Abnormal; Notable for the following components:   Calcium 8.4 (*)    All other components within normal limits  TROPONIN I (HIGH SENSITIVITY)    EKG EKG Interpretation  Date/Time:  Wednesday October 15 2019 17:14:31 EDT Ventricular Rate:  56 PR Interval:    QRS Duration: 100 QT Interval:  448 QTC Calculation: 433 R Axis:   23 Text Interpretation: Sinus rhythm Abnormal R-wave progression, early transition No old tracing to compare Confirmed by Melene Plan [phone]) on 10/15/2019 5:57:51 PM   Radiology DG Chest Port 1 View  Result Date: 10/15/2019 CLINICAL DATA:  Chest pain, hypertension EXAM: PORTABLE CHEST 1 VIEW COMPARISON:  02/07/2018 FINDINGS: The heart size and mediastinal contours are within normal limits. Both lungs are clear. The visualized skeletal structures are unremarkable. IMPRESSION: No active disease. Electronically Signed   By: Helyn Numbers MD   On: 10/15/2019 17:53    Procedures Procedures (including critical care time)  Medications Ordered in ED Medications - No data to display  ED Course  I have reviewed the triage vital signs and the nursing notes.  Pertinent labs & imaging results that were available during my care of the patient were reviewed by me and considered in my medical decision  making (see chart for details).    MDM Rules/Calculators/A&P                          85 year old male with a chief complaint of chest pain. I was able to see his initial EKG done by EMS concerning for posterior STEMI with ST depressions and flipped T waves in V2 V3. Patient symptoms likely have resolved and he is pain-free. I discussed with the family my concern about his EKG changes and they were willing to have lab work performed and a chest x-ray. Initial troponin is negative other lab work is unremarkable no significant anemia no significant electrolyte abnormality. I discussed neck steps with the family. Discussed typically checking a second troponin on the patient. They discussed it with him themselves and decided that they would never want to have a cardiac intervention performed. They felt that that would limit some of their options in the hospital and thoughts that it may be best to follow-up as an outpatient. We will have the patient start a baby aspirin. He is significantly bradycardic and so we will not start a beta-blocker. We will have him follow-up with cardiology as an outpatient. Offered to have him return at any time he would like [place] to reevaluate.  6:18 PM:  I have discussed the diagnosis/risks/treatment options with the patient and family and believe the pt to be eligible for discharge home to follow-up with PCP, Cards. We also discussed returning to the ED immediately if new or worsening sx occur. We discussed the sx which are most concerning (e.g., sudden worsening pain, fever, inability to tolerate by mouth) that necessitate immediate return. Medications administered to the patient during their visit and any new prescriptions provided to the patient are listed below.  Medications given during this visit Medications - No data to display   The patient appears reasonably screen and/or stabilized for discharge and I doubt any other medical condition or other [facility] requiring further  screening, evaluation, or treatment in the ED at this time prior to discharge.   Final Clinical Impression(s) / ED  Diagnoses Final diagnoses:  Chest pain with high risk for cardiac etiology    Rx / DC Orders ED Discharge Orders         Ordered    aspirin 81 MG chewable tablet  Daily     Discontinue  Reprint     10/15/19 1812           Melene Plan, DO 10/15/19 1818

## 2019-10-15 NOTE — ED Triage Notes (Signed)
Per [facility]: pt cancelled code STEMI. Pt had reported sudden chest pain at 4 pm today that was described as pressure with pain 8/10. Pt was found to have ST depression in V2, V3, V4 V5, V6. Pt was given 324 ASA and 500 ml of NS with EMS. Pt did start saying that his pain was better en route and EMS noted diminished ST depression throughout about the same time. Pt is oriented X 1 - to person, this is reportedly baseline, pt has hx of parkinson's and dementia. Pt has an 18 G in L AC as well as 18 G L wrist, started by EMS. Pt reportedly a DNR and Hospice pt.

## 2019-10-15 NOTE — Discharge Instructions (Signed)
Please return for recurrence of pain or if you would like to be evaluated further.  Please follow up with the cardiologist in the office.  Start taking an 81mg  aspirin a day.  Let your family doc know about your visit here today.

## 2019-12-19 ENCOUNTER — Encounter: Payer: Self-pay | Admitting: Cardiology

## 2019-12-19 ENCOUNTER — Other Ambulatory Visit: Payer: Self-pay

## 2019-12-19 ENCOUNTER — Ambulatory Visit: Payer: Medicare Other | Admitting: Cardiology

## 2019-12-19 VITALS — BP 102/55 | HR 63 | Ht 74.0 in | Wt 182.0 lb

## 2019-12-19 DIAGNOSIS — R55 Syncope and collapse: Secondary | ICD-10-CM | POA: Insufficient documentation

## 2019-12-19 NOTE — Progress Notes (Signed)
Patient referred by Berkley Harvey, NP for syncope  Subjective:   Anthony Lambert, male    DOB: 16-Mar-1935, 85 y.o.   MRN: [id number]   Chief Complaint  Patient presents with  . Loss of Consciousness  . New Patient (Initial Visit)     HPI  85 y.o. Caucasian male with advanced Parkinson's disease, referred for evaluation of syncope.  Patient is here with her full time caregiver Jenny Reichmann. Patient is very hard of hearing, does not communicate verbally at today's visit. History provided by [facility].   Patient lives with his wife. He needs help with all his ADL's. According to [facility], his Parkinson's disease has progressed with decline in his functional capacity by 30% in last 3 years.   Patient was seen in the ED in 10/2019 with chest pain. Code STEMI initiated by EMS was canceled at the ED after evaluation by STEMI team. Trop HSX1 was 10. Patient was since then seen by PCP. He is DNR, in home hospice, and does not want any aggressive invasive management.   Patient has had at least a few episodes of fall and loss of consciousness in the past few days.  On most occasions, he is standing when these episodes occur, once he was on the commode. He felt lightheaded, expressed that he was about to fall, leaned down, and reportedly lost consciousness for up to 10 min. According to [facility], he did not report any chest pain shortness of breath symptoms.   Past Medical History:  Diagnosis Date  . Chronic low back pain   . Chronic renal insufficiency   . Depression   . Dyslipidemia   . Frozen shoulder    Left  . HOH (hard of hearing)    bilateral hearing aids  . Hypertension   . Macular degeneration   . Memory disturbance   . Obesity   . Parkinson's disease ([place])   . REM sleep behavior disorder   . Shingles    Right partietal shingles  . Strabismus      Past Surgical History:  Procedure Laterality Date  . COLONOSCOPY W/ POLYPECTOMY    . DUPUYTREN CONTRACTURE RELEASE Bilateral   . EYE  SURGERY Left    strabismus sx as child     Social History   Tobacco Use  Smoking Status Never Smoker  Smokeless Tobacco Never Used    Social History   Substance and Sexual Activity  Alcohol Use No  . Alcohol/week: 0.0 standard drinks     Family History  Problem Relation Age of Onset  . Cancer - Colon Mother   . Cancer Sister        Breast cancer  . Cancer - Prostate Brother      Current Outpatient Medications on File Prior to Visit  Medication Sig Dispense Refill  . aspirin 81 MG chewable tablet Chew 1 tablet (81 mg total) by mouth daily. 30 tablet 0  . carbidopa-levodopa (SINEMET IR) 25-250 MG tablet TAKE 1 TABLET FOUR TIMES A DAY 360 tablet 3  . Cholecalciferol 25 MCG (1000 UT) tablet Take 1 tablet by mouth daily.    Marland Kitchen neomycin-polymyxin-hydrocortisone (CORTISPORIN) OTIC solution Apply 1-2 drops to toe after soaking twice a day 10 mL 0  . polyethylene glycol (MIRALAX / GLYCOLAX) packet Take 17 g by mouth daily as needed.     . sertraline (ZOLOFT) 50 MG tablet Take 25 mg by mouth daily.    . tamsulosin (FLOMAX) 0.4 MG CAPS capsule Take 1 capsule by  mouth at bedtime.    Marland Kitchen UNABLE TO FIND Lightweight wheelchair / cushion/ foot rests    . vitamin B-12 (CYANOCOBALAMIN) 500 MCG tablet Take 500 mcg by mouth daily.     No current facility-administered medications on file prior to visit.    Cardiovascular and other pertinent studies:  EKG 12/19/2019: Sinus rhythm 64 bpm Normal EKG   EKG 10/15/2019: Sinus rhythm wPVC's  Echocardiogram 2010: - Overall left ventricular systolic function was normal. Left     ventricular ejection fraction was estimated to be 65 %. There     were no left ventricular regional wall motion abnormalities.     Left ventricular wall thickness was moderately increased.     Doppler parameters were consistent with high left ventricular     filling pressure.  - The left atrium was mildly dilated.  - The estimated peak  pulmonary artery systolic pressure was mildly     increased.     Recent labs: 10/15/2019: Glucose 96, BUN/Cr 13/0.85. EGFR >60. Na/K 136/3.7.  H/H 13/39. MCV 95. Platelets 146 Trop HS 10    Review of Systems  Unable to perform ROS: patient nonverbal  Cardiovascular: Positive for syncope.         Vitals:   12/19/19 1431  BP: (!) 102/55  Pulse: 63  SpO2: 94%     Body mass index is 23.37 kg/m. Filed Weights   12/19/19 1431  Weight: 182 lb (82.6 kg)     Objective:   Physical Exam Vitals and nursing note reviewed.  Constitutional:      General: He is not in acute distress. Neck:     Vascular: No JVD.  Cardiovascular:     Rate and Rhythm: Normal rate and regular rhythm.     Heart sounds: Normal heart sounds. No murmur heard.   Pulmonary:     Effort: Pulmonary effort is normal.     Breath sounds: Normal breath sounds. No wheezing or rales.            Assessment & Recommendations:   85 y.o. Caucasian male with advanced Parkinson's disease, referred for evaluation of syncope.  Syncope: Differential is wide, including vasovagal syncope, orthostatic hypotension, cardiac arrhythmia. Given his advanced Parkinsons disease, the first two are most likely. Given his DNR, home hospice status, and wishes to pursue conservative management, I would not recommend any tests-but rather recommend empiric management with liberal hydration, use of compression stockings. Educated patient's caregiver Jenny Reichmann on avoiding rapid changes in posture, taking time while standing up.Also encouraged to discuss with neurologist re: gait exercises for Parkinson's disease   I will see him on as needed basis.   Thank you for referring the patient to [place]. Please feel free to contact with any questions.    Nigel Mormon, MD Pager: [phone] Office: [phone]

## 2019-12-19 NOTE — Patient Instructions (Signed)
Your episodes of syncope (passing out), most likely are related to vasovagal syncope or orthostatic hypotension, conditions that can make your blood pressure drop especially when you are standing up.  Recommend drinking up to 2-3 liters of water every day.  Recommend wearing compression stockings.  Also, consult with your neurologist regarding any exercises that could help in progressive Parkinson's disease, in order to avoid orthostatic hypotension.

## 2019-12-19 NOTE — Progress Notes (Deleted)
Patient referred by Berkley Harvey, NP for ***  Subjective:   Anthony Lambert, male    DOB: 25-Nov-1934, 85 y.o.   MRN: [id number]  *** No chief complaint on file.   *** HPI  85 y.o. *** male with ***  PCP 10/30/2019: He was seen at ED 7/14 Chest pain, was thought to be having at STEMI, however, labs did not reveal on first troponin, lytes any abnormality. Pt and family decided to not wait for second set of enzymes to be obtained. His pain resolved and he has been fine since. . Chest pain has not returned since that time. They do not want any cardiology surgery, etc. Taking asa daily, he is a DNR. Was on hospice. Now Is cared for by family and has nursing care at home 24 hours a day for hx of parkinson's.  He is eating well. Having 2 meals daily. His constipation has improved. He is feeling less weak overall.  No n/v. No fever. No cough, no falls.  Son and wife in office today.    *** Past Medical History:  Diagnosis Date  . Chronic low back pain   . Chronic renal insufficiency   . Depression   . Dyslipidemia   . Frozen shoulder    Left  . HOH (hard of hearing)    bilateral hearing aids  . Hypertension   . Macular degeneration   . Memory disturbance   . Obesity   . Parkinson's disease ([place])   . REM sleep behavior disorder   . Shingles    Right partietal shingles  . Strabismus     *** Past Surgical History:  Procedure Laterality Date  . COLONOSCOPY W/ POLYPECTOMY    . DUPUYTREN CONTRACTURE RELEASE Bilateral   . EYE SURGERY Left    strabismus sx as child    *** Social History   Tobacco Use  Smoking Status Never Smoker  Smokeless Tobacco Never Used    Social History   Substance and Sexual Activity  Alcohol Use No  . Alcohol/week: 0.0 standard drinks    *** Family History  Problem Relation Age of Onset  . Cancer - Colon Mother   . Cancer Sister        Breast cancer  . Cancer - Prostate Brother     *** Current Outpatient Medications on File  Prior to Visit  Medication Sig Dispense Refill  . aspirin 81 MG chewable tablet Chew 1 tablet (81 mg total) by mouth daily. 30 tablet 0  . carbidopa-levodopa (SINEMET IR) 25-250 MG tablet TAKE 1 TABLET FOUR TIMES A DAY 360 tablet 3  . donepezil (ARICEPT) 10 MG tablet Take 1 tablet (10 mg total) by mouth at bedtime. 90 tablet 3  . fenofibrate 160 MG tablet Take 160 mg by mouth daily.    . hydrocortisone (ANUSOL-HC) 25 MG suppository Place rectally.    Marland Kitchen neomycin-polymyxin-hydrocortisone (CORTISPORIN) OTIC solution Apply 1-2 drops to toe after soaking twice a day 10 mL 0  . polyethylene glycol (MIRALAX / GLYCOLAX) packet Take 17 g by mouth daily as needed.     Marland Kitchen UNABLE TO FIND Lightweight wheelchair / cushion/ foot rests    . vitamin B-12 (CYANOCOBALAMIN) 500 MCG tablet Take 500 mcg by mouth daily.     No current facility-administered medications on file prior to visit.    Cardiovascular and other pertinent studies:  *** EKG ***/***/202***: ***  EKG 10/15/2019: Sinus rhythm wPVC's  Echocardiogram 2010: - Overall left ventricular systolic function  was normal. Left     ventricular ejection fraction was estimated to be 65 %. There     were no left ventricular regional wall motion abnormalities.     Left ventricular wall thickness was moderately increased.     Doppler parameters were consistent with high left ventricular     filling pressure.  - The left atrium was mildly dilated.  - The estimated peak pulmonary artery systolic pressure was mildly     increased.    *** Recent labs: 10/15/2019: Glucose 96, BUN/Cr 13/0.85. EGFR >60. Na/K 136/3.7.  H/H 13/39. MCV 95. Platelets 146 ***HbA1C ***% Chol ***, TG ***, HDL ***, LDL *** ***TSH ***normal Trop HS 10   *** ROS      *** There were no vitals filed for this visit.  *** There is no height or weight on file to calculate BMI. There were no vitals filed for this visit.  *** Objective:    Physical Exam    ***     Assessment & Recommendations:   ***  ***     Thank you for referring the patient to [place]. Please feel free to contact with any questions.   Nigel Mormon, MD Pager: [phone] Office: [phone]

## 2020-03-19 ENCOUNTER — Telehealth: Payer: Self-pay

## 2020-03-19 NOTE — Telephone Encounter (Signed)
Volunteer called patient on behalf of Palliative Care and patient is doing okay.

## 2020-06-01 DEATH — deceased
